# Patient Record
Sex: Female | Born: 1955 | Race: Black or African American | Hispanic: No | Marital: Married | Smoking: Former smoker
Health system: Southern US, Community
[De-identification: ages and names within clinical notes are randomized; demographics above are authoritative.]

## PROBLEM LIST (undated history)

## (undated) DIAGNOSIS — N189 Chronic kidney disease, unspecified: Secondary | ICD-10-CM

## (undated) DIAGNOSIS — G4730 Sleep apnea, unspecified: Secondary | ICD-10-CM

## (undated) DIAGNOSIS — E2601 Conn's syndrome: Secondary | ICD-10-CM

## (undated) DIAGNOSIS — I639 Cerebral infarction, unspecified: Secondary | ICD-10-CM

## (undated) DIAGNOSIS — E785 Hyperlipidemia, unspecified: Secondary | ICD-10-CM

## (undated) DIAGNOSIS — R413 Other amnesia: Secondary | ICD-10-CM

## (undated) DIAGNOSIS — I10 Essential (primary) hypertension: Secondary | ICD-10-CM

## (undated) DIAGNOSIS — Z9289 Personal history of other medical treatment: Secondary | ICD-10-CM

## (undated) HISTORY — DX: Conn's syndrome: E26.01

## (undated) HISTORY — DX: Chronic kidney disease, unspecified: N18.9

## (undated) HISTORY — DX: Hyperlipidemia, unspecified: E78.5

## (undated) HISTORY — DX: Personal history of other medical treatment: Z92.89

## (undated) HISTORY — DX: Other amnesia: R41.3

## (undated) HISTORY — DX: Essential (primary) hypertension: I10

## (undated) HISTORY — DX: Cerebral infarction, unspecified: I63.9

## (undated) HISTORY — DX: Sleep apnea, unspecified: G47.30

---
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
## 1985-05-09 HISTORY — PX: UTERINE FIBROID SURGERY: SHX826

## 1991-05-10 HISTORY — PX: ABDOMINAL HYSTERECTOMY: SHX81

## 1998-05-27 ENCOUNTER — Other Ambulatory Visit: Admission: RE | Admit: 1998-05-27 | Discharge: 1998-05-27 | Payer: Self-pay | Admitting: Obstetrics and Gynecology

## 1999-06-04 ENCOUNTER — Other Ambulatory Visit: Admission: RE | Admit: 1999-06-04 | Discharge: 1999-06-04 | Payer: Self-pay | Admitting: *Deleted

## 1999-08-17 ENCOUNTER — Encounter: Admission: RE | Admit: 1999-08-17 | Discharge: 1999-08-17 | Payer: Self-pay | Admitting: Obstetrics & Gynecology

## 1999-08-17 ENCOUNTER — Encounter: Payer: Self-pay | Admitting: Obstetrics & Gynecology

## 2000-01-06 ENCOUNTER — Other Ambulatory Visit: Admission: RE | Admit: 2000-01-06 | Discharge: 2000-01-06 | Payer: Self-pay | Admitting: *Deleted

## 2000-07-07 ENCOUNTER — Other Ambulatory Visit: Admission: RE | Admit: 2000-07-07 | Discharge: 2000-07-07 | Payer: Self-pay | Admitting: *Deleted

## 2000-08-17 ENCOUNTER — Encounter: Admission: RE | Admit: 2000-08-17 | Discharge: 2000-08-17 | Payer: Self-pay | Admitting: Obstetrics & Gynecology

## 2000-08-17 ENCOUNTER — Encounter: Payer: Self-pay | Admitting: Obstetrics & Gynecology

## 2000-09-25 ENCOUNTER — Encounter: Admission: RE | Admit: 2000-09-25 | Discharge: 2000-12-06 | Payer: Self-pay | Admitting: Pulmonary Disease

## 2002-07-12 ENCOUNTER — Other Ambulatory Visit: Admission: RE | Admit: 2002-07-12 | Discharge: 2002-07-12 | Payer: Self-pay | Admitting: Obstetrics & Gynecology

## 2003-08-19 ENCOUNTER — Ambulatory Visit (HOSPITAL_BASED_OUTPATIENT_CLINIC_OR_DEPARTMENT_OTHER): Admission: RE | Admit: 2003-08-19 | Discharge: 2003-08-19 | Payer: Self-pay | Admitting: Family Medicine

## 2003-09-12 ENCOUNTER — Ambulatory Visit (HOSPITAL_BASED_OUTPATIENT_CLINIC_OR_DEPARTMENT_OTHER): Admission: RE | Admit: 2003-09-12 | Discharge: 2003-09-12 | Payer: Self-pay | Admitting: Family Medicine

## 2003-10-03 ENCOUNTER — Other Ambulatory Visit: Admission: RE | Admit: 2003-10-03 | Discharge: 2003-10-03 | Payer: Self-pay | Admitting: Obstetrics & Gynecology

## 2004-01-16 ENCOUNTER — Ambulatory Visit: Payer: Self-pay | Admitting: Infectious Diseases

## 2004-01-16 ENCOUNTER — Encounter (INDEPENDENT_AMBULATORY_CARE_PROVIDER_SITE_OTHER): Payer: Self-pay | Admitting: *Deleted

## 2004-01-16 ENCOUNTER — Inpatient Hospital Stay (HOSPITAL_COMMUNITY): Admission: EM | Admit: 2004-01-16 | Discharge: 2004-01-19 | Payer: Self-pay | Admitting: Emergency Medicine

## 2004-05-09 DIAGNOSIS — I639 Cerebral infarction, unspecified: Secondary | ICD-10-CM

## 2004-05-09 HISTORY — DX: Cerebral infarction, unspecified: I63.9

## 2004-05-18 ENCOUNTER — Inpatient Hospital Stay (HOSPITAL_COMMUNITY): Admission: EM | Admit: 2004-05-18 | Discharge: 2004-05-21 | Payer: Self-pay | Admitting: *Deleted

## 2004-05-21 ENCOUNTER — Inpatient Hospital Stay (HOSPITAL_COMMUNITY)
Admission: RE | Admit: 2004-05-21 | Discharge: 2004-06-09 | Payer: Self-pay | Admitting: Physical Medicine & Rehabilitation

## 2004-05-21 ENCOUNTER — Ambulatory Visit: Payer: Self-pay | Admitting: Physical Medicine & Rehabilitation

## 2004-07-12 ENCOUNTER — Encounter
Admission: RE | Admit: 2004-07-12 | Discharge: 2004-10-10 | Payer: Self-pay | Admitting: Physical Medicine & Rehabilitation

## 2004-07-13 ENCOUNTER — Ambulatory Visit: Payer: Self-pay | Admitting: Physical Medicine & Rehabilitation

## 2004-08-02 ENCOUNTER — Encounter
Admission: RE | Admit: 2004-08-02 | Discharge: 2004-10-28 | Payer: Self-pay | Admitting: Physical Medicine & Rehabilitation

## 2004-09-08 ENCOUNTER — Ambulatory Visit: Payer: Self-pay | Admitting: Internal Medicine

## 2004-09-14 ENCOUNTER — Ambulatory Visit: Payer: Self-pay | Admitting: Physical Medicine & Rehabilitation

## 2004-09-20 ENCOUNTER — Ambulatory Visit: Payer: Self-pay

## 2004-10-15 ENCOUNTER — Encounter: Admission: RE | Admit: 2004-10-15 | Discharge: 2005-01-13 | Payer: Self-pay | Admitting: Pulmonary Disease

## 2004-10-15 ENCOUNTER — Ambulatory Visit: Payer: Self-pay | Admitting: Physical Medicine & Rehabilitation

## 2004-10-15 ENCOUNTER — Ambulatory Visit: Payer: Self-pay | Admitting: Internal Medicine

## 2005-01-06 ENCOUNTER — Ambulatory Visit: Payer: Self-pay | Admitting: Internal Medicine

## 2005-01-17 ENCOUNTER — Encounter
Admission: RE | Admit: 2005-01-17 | Discharge: 2005-04-17 | Payer: Self-pay | Admitting: Physical Medicine & Rehabilitation

## 2005-01-17 ENCOUNTER — Ambulatory Visit: Payer: Self-pay | Admitting: Physical Medicine & Rehabilitation

## 2005-06-15 ENCOUNTER — Encounter: Admission: RE | Admit: 2005-06-15 | Discharge: 2005-09-13 | Payer: Self-pay | Admitting: Pulmonary Disease

## 2005-07-08 ENCOUNTER — Ambulatory Visit: Payer: Self-pay | Admitting: Physical Medicine & Rehabilitation

## 2005-07-08 ENCOUNTER — Encounter
Admission: RE | Admit: 2005-07-08 | Discharge: 2005-10-06 | Payer: Self-pay | Admitting: Physical Medicine & Rehabilitation

## 2005-08-17 ENCOUNTER — Encounter (INDEPENDENT_AMBULATORY_CARE_PROVIDER_SITE_OTHER): Payer: Self-pay | Admitting: *Deleted

## 2005-08-17 ENCOUNTER — Ambulatory Visit (HOSPITAL_COMMUNITY): Admission: RE | Admit: 2005-08-17 | Discharge: 2005-08-17 | Payer: Self-pay | Admitting: *Deleted

## 2005-09-05 ENCOUNTER — Ambulatory Visit: Payer: Self-pay | Admitting: Physical Medicine & Rehabilitation

## 2005-10-12 ENCOUNTER — Encounter: Admission: RE | Admit: 2005-10-12 | Discharge: 2005-10-12 | Payer: Self-pay | Admitting: Endocrinology

## 2005-11-28 ENCOUNTER — Ambulatory Visit: Payer: Self-pay | Admitting: Physical Medicine & Rehabilitation

## 2005-11-28 ENCOUNTER — Encounter
Admission: RE | Admit: 2005-11-28 | Discharge: 2006-02-26 | Payer: Self-pay | Admitting: Physical Medicine & Rehabilitation

## 2006-10-13 ENCOUNTER — Encounter: Admission: RE | Admit: 2006-10-13 | Discharge: 2006-10-13 | Payer: Self-pay | Admitting: Endocrinology

## 2006-10-19 ENCOUNTER — Ambulatory Visit: Payer: Self-pay | Admitting: Physical Medicine & Rehabilitation

## 2006-10-19 ENCOUNTER — Encounter
Admission: RE | Admit: 2006-10-19 | Discharge: 2007-01-17 | Payer: Self-pay | Admitting: Physical Medicine & Rehabilitation

## 2006-11-01 ENCOUNTER — Encounter: Admission: RE | Admit: 2006-11-01 | Discharge: 2006-11-01 | Payer: Self-pay | Admitting: Endocrinology

## 2006-11-15 ENCOUNTER — Encounter: Admission: RE | Admit: 2006-11-15 | Discharge: 2006-11-15 | Payer: Self-pay | Admitting: Endocrinology

## 2007-01-17 ENCOUNTER — Encounter: Admission: RE | Admit: 2007-01-17 | Discharge: 2007-01-17 | Payer: Self-pay | Admitting: General Surgery

## 2007-01-19 ENCOUNTER — Encounter (INDEPENDENT_AMBULATORY_CARE_PROVIDER_SITE_OTHER): Payer: Self-pay | Admitting: General Surgery

## 2007-01-19 ENCOUNTER — Encounter: Admission: RE | Admit: 2007-01-19 | Discharge: 2007-01-19 | Payer: Self-pay | Admitting: General Surgery

## 2007-01-19 ENCOUNTER — Ambulatory Visit (HOSPITAL_BASED_OUTPATIENT_CLINIC_OR_DEPARTMENT_OTHER): Admission: RE | Admit: 2007-01-19 | Discharge: 2007-01-19 | Payer: Self-pay | Admitting: General Surgery

## 2007-03-08 ENCOUNTER — Encounter
Admission: RE | Admit: 2007-03-08 | Discharge: 2007-03-09 | Payer: Self-pay | Admitting: Physical Medicine & Rehabilitation

## 2007-03-08 ENCOUNTER — Ambulatory Visit: Payer: Self-pay | Admitting: Physical Medicine & Rehabilitation

## 2007-03-22 ENCOUNTER — Encounter
Admission: RE | Admit: 2007-03-22 | Discharge: 2007-05-21 | Payer: Self-pay | Admitting: Physical Medicine & Rehabilitation

## 2007-07-02 ENCOUNTER — Encounter
Admission: RE | Admit: 2007-07-02 | Discharge: 2007-07-02 | Payer: Self-pay | Admitting: Physical Medicine & Rehabilitation

## 2007-07-02 ENCOUNTER — Ambulatory Visit: Payer: Self-pay | Admitting: Physical Medicine & Rehabilitation

## 2007-12-25 ENCOUNTER — Encounter
Admission: RE | Admit: 2007-12-25 | Discharge: 2007-12-26 | Payer: Self-pay | Admitting: Physical Medicine & Rehabilitation

## 2007-12-26 ENCOUNTER — Ambulatory Visit: Payer: Self-pay | Admitting: Physical Medicine & Rehabilitation

## 2009-01-09 ENCOUNTER — Encounter
Admission: RE | Admit: 2009-01-09 | Discharge: 2009-01-14 | Payer: Self-pay | Admitting: Physical Medicine & Rehabilitation

## 2009-01-14 ENCOUNTER — Ambulatory Visit: Payer: Self-pay | Admitting: Physical Medicine & Rehabilitation

## 2009-01-26 ENCOUNTER — Encounter
Admission: RE | Admit: 2009-01-26 | Discharge: 2009-04-08 | Payer: Self-pay | Admitting: Physical Medicine & Rehabilitation

## 2009-12-30 ENCOUNTER — Encounter
Admission: RE | Admit: 2009-12-30 | Discharge: 2010-03-30 | Payer: Self-pay | Admitting: Physical Medicine & Rehabilitation

## 2010-01-01 ENCOUNTER — Ambulatory Visit: Payer: Self-pay | Admitting: Physical Medicine & Rehabilitation

## 2010-09-21 NOTE — Op Note (Signed)
NAMELUCIELLA, AUSTRIA              ACCOUNT NO.:  [id number]   MEDICAL RECORD NO.:  [id number]          PATIENT TYPE:  AMB   LOCATION:  DSC                          FACILITY:  [facility]   PHYSICIAN:  Edsel Petrin. Dalbert Batman, M.D.DATE OF BIRTH:  26-Jan-1956   DATE OF PROCEDURE:  01/19/2007  DATE OF DISCHARGE:                               OPERATIVE REPORT   PREOPERATIVE DIAGNOSIS:  Bilateral microcalcifications of the breast.   POSTOPERATIVE DIAGNOSIS:  Bilateral microcalcifications of the breast,  rule out ductal carcinoma in situ.   OPERATION PERFORMED:  1. Right breast biopsy with needle localization and specimen      mammogram.  2. Left breast biopsy with needle localization and specimen mammogram.   SURGEON:  Dr. Fanny Skates.   OPERATIVE INDICATIONS:  This is a 55 year old black female who underwent  screening mammograms and digital diagnostic mammograms in June of this  year.  There were areas of focal calcifications in the right and left  breast.  These clusters of calcifications were noted deep within the mid  to lateral aspect of both breasts.  They were both felt to be  suspicious.  The patient has had a stroke and is diabetic and does not  have good bladder control and some difficulty breathing and so the  [facility] felt that they would be unable to perform a  stereotactic biopsy.  The patient was referred to me.  Physical exam  does not show any focal mask although the breasts are large and a little  bit lumpy.  I agreed that these area should be biopsied for pathologic  confirmation. This was discussed with her and she is brought electively.   OPERATIVE TECHNIQUE:  The patient underwent bilateral needle  localization at the [facility] this morning.  Both  wires entered laterally and were directed deep within the breast on each  side directed toward the center of the breast.  The wires were placed  very adequately.  The patient was brought  to [facility] and  then to the operating room where a general anesthetic was induced.  Both  breasts were prepped and draped in a sterile fashion.  The patient was  identified as the correct patient, correct procedure and correct site.  0.5% Marcaine with epinephrine was used as local infiltration  anesthetic.   We did the right breast biopsy first.  A transverse radially oriented  incision was made laterally in the right breast at about the 9 o'clock  position.  Dissection was carried down into the breast tissue and we  went deeply into the breast tissue medially and posteriorly following  the wire until we excised all of the breast tissue around the wire.  The  specimen was marked with silk sutures to orient the pathologist.  Specimen mammogram was performed and the radiologist called and said  that we had completely removed the calcifications. This wound was packed  after obtaining hemostasis with electrocautery.   On the left side we then performed the breast biopsy.  In a similar  manner, we placed a transverse radially  oriented incision through the  insertion site of the wire.  Dissection was carried down deep into the  breast tissue and we were able to get all around the wire without too  much difficulty.  We marked the specimen with silk sutures.  The  Specimen mammogram looked good showing that the calcifications had been  completely removed.  Both wounds were copiously irrigated with saline  until the irrigation fluid was completely clear.  Hemostasis was  excellent on both sides achieved with electrocautery.  The wounds were  deep and so I closed them and in several layers, a deep layer of  interrupted 3-0 Vicryl.  A superficial layer of interrupted 3-0 Vicryl  and the skin incisions were both closed  with running subcuticular sutures of 4-0 Monocryl and Steri-Strips.  Clean bandages were placed and the patient taken to the recovery room in  stable condition.   Estimated blood loss was about Q000111Q mL total.  Complications none. Sponge, needle and instrument counts were correct.      Edsel Petrin. Dalbert Batman, M.D.  Electronically Signed     HMI/MEDQ  D:  01/19/2007  T:  01/20/2007  Job:  [id number]

## 2010-09-21 NOTE — Assessment & Plan Note (Signed)
Robin Foley is back regarding her mid-brain hemorrhage.  I last saw her about a  year ago.  She has not changed a great deal since I saw her.  She has  been limited with her activity.  She uses a cane for balance.  She  denies any falls or new problems at home.  She continues to have  difficulties with concentration, memory, and organization.  She  complains of some low back pain.  She has problems with balance and  sensation still.  Vision is an issue with diplopia reported.  She also  reports recent problems with urinary incontinence.  She sleeps fairly  well.  She is active around her house and tries to keep up with  household activities, albeit a struggle at times.   REVIEW OF SYSTEMS:  Notable for the above as well as weight gain, urine  retention, shortness of breath, sleep apnea, trouble walking, tingling.   SOCIAL HISTORY:  Patient is married living with her husband and family.   PHYSICAL EXAMINATION:  Blood pressure is 171/74, pulse is 79,  respiratory rate 20, she is sating 96% on room air.  The patient is  generally pleasant.  Alert and oriented x3.  She needs queues sometimes  for organization and attention but generally is functional on a  conversational level with her cognition.  Motor function is generally 4+  to 5 out of 5 on the right, and 5 out of 5 on the left.  She has  decreased sensation still on the right side today.  When she ambulated  she tends to walk with an anteriorly tilted pelvis and weight shift  forward.  She often got in front of her cane and seemed to be at risk  for falling.  CRANIAL NERVE EXAM:  She revealed mild disconjugate eye movement.  I  would say overall her awareness and attention was improved from last  visit.  HEART:  Regular.  CHEST:  Clear.  ABDOMEN:  Soft, nontender.  She remains obese.   ASSESSMENT:  1. Right mid-brain intracranial hemorrhage.  2. Insulin-requiring diabetes.  3. Hypertension.  4. Obesity.  5. Diabetic peripheral  neuropathy.  6. Low back pain.   PLAN:  1. Encouraged further exercise and diet control.  She seems to      continue struggling with these areas.  2. Filled out extensive insurance paperwork for her today.  I do not      see her being able to return to work due to her physical and      cognitive deficits.  3. Medical management per Dr. Wilson Singer.  He needs to check on her blood      pressure at his next appointment with her.      Meredith Staggers, M.D.  Electronically Signed    ZTS/MedQ  D:  10/24/2006 12:45:44  T:  10/24/2006 15:49:48  Job #:  [id number]   cc:   Ronaldo Miyamoto, M.D.  Fax: [phone]

## 2010-09-21 NOTE — Assessment & Plan Note (Signed)
Robin Foley is here in followup of her right mid brain hemorrhage.  She had questions about returning to physical therapy.  She uses a cane  for balance.  She would like to go back to driving.  She wants to get  off of her cane.  She states that she has had no problems with her  vision.  Family still has concerns of over-impulsivity and attention  span.  She is really not doing much exercise and has gained significant  weight since I last saw her.   REVIEW OF SYSTEMS:  Notable for the above.  She has occasional bladder  problems as well.  Full review is in the written health and history  section of the chart.   SOCIAL HISTORY:  The patient is married and living with her family.   PHYSICAL EXAM:  Blood pressure is 157/67, pulse 80, respiratory rate 16.  She is satting 98% on room air.  The patient has gained noticeable weight.  Motor function remains 4+/5  to 5/5 on the right side and similar on the left.  She has decreased  sensation still on the left leg and arm.  She tends to hyperextend the  left knee in stance phase of gait.  I had her walk today and she was  walking very quickly, and I asked her to turn around, and she spun  quickly and lost her balance, and fell to the floor.  Fortunately, she  was able to catch herself and had no significant injury.  She is able to  stand up with minimal assistance and walk normally back to the exam  room.  Gaze appeared to be intact for conjugate pursuit.  She had no  blurred vision, double vision, or field cuts today.  Speech is a little  bit dysarthric.  The patient does lack some awareness still in  attention.  HEART:  Regular rate.  CHEST:  Clear.  ABDOMEN:  Soft and nontender.  She remains obese.   ASSESSMENT:  1. Right mid brain endocranial hemorrhage.  2. Insulin requiring diabetes.  3. Hypertension.  4. Morbid obesity.  5. Diabetic peripheral neuropathy.  6. Low back pain.   PLAN:  1. I will send her back to physical  therapy to work on gait and safety      awareness.  I am not sure that she will be able to be a driver, but      will look at that down the line.  2. Discussed appropriate diet and exercise in the future.  She is      going to need to work on this to gain better control of her sugars,      as well as her balance.  3. I will see her back in about 3 months' time for followup.  Again,      the patient appeared to be without injury upon leaving the office      today.      Meredith Staggers, M.D.  Electronically Signed     ZTS/MedQ  D:  03/09/2007 15:43:11  T:  03/10/2007 15:30:50  Job #:  [id number]   cc:   Ronaldo Miyamoto, M.D.  Fax: [phone]

## 2010-09-21 NOTE — Assessment & Plan Note (Signed)
Robin Foley is back regarding her right mid brain hemorrhage.  She had since  gone back to physical therapy and made some gains but did not follow it  up with home program.  She is really  not doing much at home. She walks  with a cane.  She complains of increasing right knee pain and has some  pain in her fingers.  Pain does not effect her sleep, however.   On review of systems she reports some bladder control problems, weight  gain, night sweats, high sugars at times.  Full review is in her written  health and history section under pertinent positives as above.   SOCIAL HISTORY:  The patient is married living with her husband and  children.   PHYSICAL EXAMINATION:  VITALS:  Blood pressure is 150/70, pulse 75,  respiratory rate 20.  She is satting 99% on room air.  EXTREMITIES:  The patient has good motor function on the right side,  4+/5.  She continues to have some decreased overall sensation left arm  and leg.  She has decreased sensation distally in both hands and feet.  She walked for me today and is antalgic to the right side.  There is  some crepitus with active flexion extension of the knee.  She had no  knee instability per se.  Some mild peripatellar swelling was  appreciated.  Gait is stable.  NEURO:  Cognitively she was fair for insight and awareness, memory.  Cranial nerve is unremarkable.  Speech is clear.  HEART:  Regular.  CHEST:  Clear.  ABDOMEN:  Soft, nontender.  She remains morbidly obese.   ASSESSMENT:  1. History of right mid brain intracranial hemorrhage.  2. Insulin requiring diabetes.  3. Hypertension.  4. Morbid obesity.  5. Diabetic peripheral neuropathy.  6. Low back pain.  7. Osteoarthritis of the right knee.   PLAN:  1. The patient would be interested  in aggressive care for the right      knee at this point.  We did discuss weight loss which is paramount      for her.  We discussed the idea of osteoarthritis knee brace which      may be of some  benefit.  Recommended glucosamine with chondroitin      for now.  She is not an antiinflammatory candidate due to her blood      pressure and stroke issues.  She needs to be more serious about her      diet and work on better control with this.  2. Continue cane for balance to consider anticonvulsant for      neuropathic pain depending on progression. I think she could do      best with weight loss and sugar control.  3. I will see her back in about 4 months time.      Meredith Staggers, M.D.  Electronically Signed     ZTS/MedQ  D:  07/02/2007 11:46:36  T:  07/02/2007 15:35:32  Job #:  [id number]

## 2010-09-21 NOTE — Assessment & Plan Note (Signed)
Robin Foley is back regarding her right midbrain hemorrhage.  She still is  having some problems with balance and mobility.  She does use her  treadmill, is moving a bit better with that and has less knee buckling.  She has had a fall over the summer.  She does report some dizziness when  she in on her treadmill and she is with the children.  She states that  the room will spin.  She does not note that it has been worsening or  happening at other times.  She is having occasional pain in her knees  and hands.  The tingling in her hands is somewhat stable.  She is  sleeping fairly well at this point.   REVIEW OF SYSTEMS:  Notable for tingling, trouble walking, and bladder  control issues.  Other pertinent positives are above, and full review is  in the written 14-point review of systems.   SOCIAL HISTORY:  The patient is married and living with her husband and  children still.   PHYSICAL EXAMINATION:  VITAL SIGNS:  Blood pressure is 150/62, pulse is  68, respiratory rate 18, and she is satting 99% on room air.  NEUROLOGIC:  The patient is generally alert and appropriate. The patient  still lacks some insight and awareness.  Memory is fair.  Strength is  4+/5 on the right side and 5/5 on the left, with decreased sensation  distally in the hands, left more than the right, today.  She also had  some sensory loss in the feet.  She has some crepitus with active and  passive knee flexion and extension.  Gait is stable, but she favors the  left side a little bit more.  She holds her cane for balance.  She has  difficulty transferring.  HEART:  Regular.  CHEST:  Clear.  ABDOMEN:  Soft and nontender.  The patient remains morbidly obese.   ASSESSMENT:  1. History of right midbrain intracranial hemorrhage.  2. Insulin-requiring diabetes.  3. Hypertension.  4. Morbid obesity.  5. Diabetic peripheral neuropathy.  6. Low back pain.  7. Osteoarthritis of the right knee.   PLAN:  1. Continue home  exercise program.  She is not appropriate for      returning to work due to ongoing cognitive and balance/neurological      deficits.  I filled out paperwork today for her disability      purposes.  2. I will see her back on as-needed basis.  Recommended ongoing home      exercise as safety and tolerance dictate.      Meredith Staggers, M.D.  Electronically Signed     ZTS/MedQ  D:  12/26/2007 13:09:51  T:  12/27/2007 05:14:44  Job #:  [id number]   cc:   Ronaldo Miyamoto, M.D.  Fax: [phone]

## 2010-09-24 NOTE — Assessment & Plan Note (Signed)
Robin Foley is back regarding her right midbrain hemorrhage.  She states that  not much as changed since last year, although she has had some left knee  pain.  She may be having some more problems through her gait.  She uses  a cane around the home, but furniture walks to a large extent.  She  denies frank pain today.  She has been on Celebrex for the last 6  months.  Moods have been stable.   REVIEW OF SYSTEMS:  Notable for trouble walking, weakness, occasional  bladder issues, weight gain.  She reports some improvement in her sleep  overall.  Full 14-point review is in the written health and history  section of the chart.   SOCIAL HISTORY:  The patient is married and living with her family.  Husband again accompanies her today.   PHYSICAL EXAMINATION:  VITAL SIGNS:  Blood pressure is 159/78, pulse is  78, respiratory rate is 16.  She is sating 96% on room air.  GENERAL:  The patient is generally alert and appropriate.  She continues  to lack through insight and awareness, although is doing a bit better in  that regard today from her last visit.  Memory is fair to good.  EXTREMITIES:  Strength is generally 4+-5/5 bilaterally.  She has some  pain inhibition still at the left knee.  She stands to transfer.  She is  unable to fully put weight to the left leg due to some discomfort.  She  has some distal sensory loss in the lower more than the upper  extremities.  Gait is notable for decreased balance.  She has to rely on  the cane and furniture as well as her husband for support and balance.  She tends to stand with a flexed posture.  She has some antalgia to the  left side as well.  HEART:  Regular.  CHEST:  Clear.  ABDOMEN:  Soft, nontender.  She remains morbidly overweight.   ASSESSMENT:  1. Right midbrain intracranial hemorrhage.  2. Insulin-requiring diabetes with neuropathy.  3. Hypertension.  4. Morbid obesity.  5. Low back pain.  6. History of osteoarthritis of the knees left  greater than right now.   PLAN:  1. The patient needs to be seen by therapy to assess gait.  I think      she needs to go to a walker as I think she is at great risk for      further falls with her cane when she is by herself.  2. Recommend glucosamine for left knee.  She may continue Celebrex per      her other doctors recommendation.  She needs to lose weight which      is an ongoing problem and certainly not helping her picture.  3. I will see her back in about a year.  I filled out her necessary      paperwork again today.      Meredith Staggers, M.D.  Electronically Signed     ZTS/MedQ  D:  01/14/2009 10:50:04  T:  01/14/2009 23:00:52  Job #:  [id number]

## 2010-09-24 NOTE — H&P (Signed)
NAME:  Robin, Foley              ACCOUNT NO.:  [id number]   MEDICAL RECORD NO.:  [id number]          PATIENT TYPE:  EMS   LOCATION:  [facility]                         FACILITY:  [facility]   PHYSICIAN:  Pramod P. Leonie Man, MD    DATE OF BIRTH:  [birth year]   DATE OF ADMISSION:  05/18/2004  DATE OF DISCHARGE:                                HISTORY & PHYSICAL   ADMISSION DIAGNOSIS:  Hemorrhage.   REFERRING PHYSICIAN:  Kyra Searles, M.D.   CLINICAL HISTORY:  Mrs. Robin Foley is a 55 year old African-American lady who  developed sudden onset of left-sided paresthesias and hand weakness and  incoordination at 7:15 a.m. today.  The patient states she woke up at 6 a.m.  and felt fine and had no symptoms.  She also noticed some bifrontal headache  following this.  Symptoms have persisted.  She presented to the emergency  room within 45 minutes of onset of symptoms and a code stroke was called.  We responded immediately.  The patient was examined by me at 9 a.m. and was  found to be awake, alert, and have mild left-sided weakness and objective  dysesthesias.  She has no prior history of stroke, TIA, or significant  medical problems.  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   PAST MEDICAL HISTORY:  Significant for:  1.  Diabetes.  2.  Hypertension.  3.  Hyperlipidemia.  4.  Mitral regurgitation.  5.  Obstructive sleep apnea.  6.  Depression.  7.  Viral meningitis.   MEDICATION LIST:  Avandamet, Zoloft, Norvasc, glipizide, albuterol,  atenolol, Hyzaar, Xanax.   MEDICATION ALLERGIES:  PENICILLIN.   SOCIAL HISTORY:  She works as a Programmer, systems.  She  lives in [place] with her husband.  She does not do drugs. She smokes  one pack per day for 27 years.  She drinks alcohol occasionally.   REVIEW OF SYSTEMS:  Not significant for recent fever, cough, chest pain,  diarrhea, or palpitations.   FAMILY HISTORY:  Not significant for anybody with stroke.  Mother had ALS.   PHYSICAL EXAMINATION:  GENERAL:  Reveals a pleasant, middle-aged African-  American lady who is not in distress.  VITAL SIGNS:  She is afebrile, pulse rate 72 of per minute and regular,  respiratory rate 18 per minute, blood pressure 176/90, distal pulses well  felt,  saturation 100% on room air.  HEENT:  Head is nontraumatic.  Neck is supple without bruit.  ENT exam  unremarkable.  CARDIAC:  No murmur or gallop.  LUNGS:  Clear to auscultation.  NEUROLOGIC:  The patient is pleasant, awake, alert, cooperative.  There is  no aphasia, apraxia, or dysarthria.  Pupils are 4 mm, equally reactive to  light and accommodation.  Visual acuity and fields are adequate.  I did not  get any visual field deficits.  Face is symmetric, palate movements are  normal, tongue is midline.  Motor system exam reveals no upper extremity  drift but fine finger movements are diminished on  the left.  She has  weakness of the left grip and intrinsic hand muscles.  She orbits the right  over left upper extremity.  Lower extremity strength is symmetric.  Deep  tendon reflexes are 2+ and symmetric.  Left plantar is upgoing, right is  downgoing.  She has subjective dysesthesia on the left side but no objective  sensory loss.  She has mild sensory ataxia on the left.  Gait was not  tested.   DATA REVIEWED:  Noncontrast head CT scan shows an area of hemorrhage in the  right midbrain involving the superior cerebellar peduncle.  There is no  subarachnoid hemorrhage.  Coagulation labs are normal.  Wbc count is  unremarkable.   IMPRESSION:  A 55 year old lady with left-sided paresthesias, weakness, and  incoordination due to right lateral midbrain hemorrhage, likely of  hypertensive etiology.   PLAN:  The patient is being admitted to the stroke service for further  evaluation.  Will maintain aggressive control of  blood pressure, continue  her home medications, use IV labetalol p.r.n., keep blood pressure systolic  in the [id number] range.  Repeat CT scan in the morning.  The patient may  qualify for the NovoSeven protocol with factor VIIa recombinant inhibitor to  prevent recurrent hemorrhage.  We will screen the patient for the same and  see if she qualifies and consider her for this.  Continue treatment for her  diabetes and hypertension and hyperlipidemia.  I had a long discussion with  the patient and her husband regarding her symptoms.  Discussed the plan for  evaluation, treatment, and answered questions.       PPS/MEDQ  D:  05/18/2004  T:  05/18/2004  Job:  [id number]

## 2010-09-24 NOTE — Discharge Summary (Signed)
NAMEMarland Kitchen  KANOSHA, Robin Foley              ACCOUNT NO.:  [id number]   MEDICAL RECORD NO.:  [id number]          PATIENT TYPE:  IPS   LOCATION:  [id number]                         FACILITY:  [facility]   PHYSICIAN:  Meredith Staggers, M.D.DATE OF BIRTH:  Aug 25, 1955   DATE OF ADMISSION:  05/21/2004  DATE OF DISCHARGE:  06/09/2004                                 DISCHARGE SUMMARY   DISCHARGE DIAGNOSES:  1.  Right mid brain intracranial hemorrhage.  2.  History of diabetes.  3.  History of hypertension.  4.  Urinary retention, improved.  5.  History of depression.   HISTORY OF PRESENT ILLNESS:  The patient is a 55 year old African-American  female with past history of hypertension, diabetes, and hyperlipidemia, who  presents on May 18, 2004, with right-sided paresthesias and hand  weakness.  Head CT revealed a hemorrhage in the right mid brain secondary to  blood pressure.  The patient received __________ treatment factor with  factor VIIA recombinant inhibitor.  Carotid Dopplers reveal no ICA stenosis  bilaterally.  PT report indicates the patient is ambulating approximately  100 feet minimum assistance with cane, bed mobility supervision, tests with  minimum assistance.  Transcranial Dopplers left MCA velocity likely due to  distal stenosis, positive double vision.  The patient was transferred to  [facility] on May 21, 2004.   REVIEW OF SYSTEMS:  Significant for shortness of breath and cough, denies  any chest pain.  Positive for weakness and depression.  Denies any weight  loss or nausea and vomiting.   PAST MEDICAL HISTORY:  1.  Diabetes.  2.  Hypertension.  3.  Tobacco abuse.  4.  Meningitis.  5.  Depression.  6.  Hyperlipidemia.  7.  Mild OSA.  8.  Benign breast tumor.   PAST SURGICAL HISTORY:  Significant for  hysterectomy.   SOCIAL HISTORY:  The patient lives in [place] with husband in two-  level home, four steps to entry.  Tobacco for 27 years.   Drinks alcohol  occasionally.  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  She was independent  prior to admission.   MEDICATIONS PRIOR TO ADMISSION:  1.  Albuterol inhaler.  2.  Atenolol 100 mg 1 tablet daily.  3.  Glucotrol 5 mg b.i.d.  4.  Avandamet 1000 mg b.i.d.  5.  Hyzaar 100/25 mg daily.  6.  Norvasc 10 mg daily.  7.  Zoloft 50 mg daily.   ALLERGIES:  PENICILLIN   HOSPITAL COURSE:  Robin Foley was admitted to [facility] on May 21, 2004.  She had over 3 hours of  therapy daily.  Overall, she progressed fairly well during 28-day stay in  rehab.  She was discharged in overall supervision level.  She had no  significant new neurological symptoms while in rehab.  She still had  occasional headache and blurry vision.  At time of discharge, indicated bed  mobility modified independent, transfer close supervision, and sit to stand,  squat to pivot close supervision.  She was able to ambulate minimum  assistance 200 feet with rolling walker.  Able to  perform most ADLs  supervision and minimum assistance level.  Basic cognition was within  functional limits.  Short-term memory mostly within functional limits.  The  patient made good progress in therapy.  She met her goals.  Dysarthria, she  is more intelligible with reading and in conversation.  The patient met all  her goals.  The patient is supervision level for tasks secondary to  decreased vision and decreased use of upper extremities.  The patient is at  minimum assistance for dynamic standing activities.   For urinary tract infection, the patient was started on Cipro 250 mg p.o.  b.i.d. for urinary tract infection.   Next problem, urinary retention.  The patient did have a neurogenic bladder  and difficulty voiding.  She was started on Flomax 0.4 mg p.o. as well as  Urecholine 10 mg b.i.d. on May 26, 2004.  Slowly, Urecholine had to be  tapered up to 25 mg t.i.d.  The patient began to void  very well, and Flomax  was discontinued at the time of discharge, and Urecholine began to be  tapered.   Next problem number is diabetes mellitus.  The patient's sugars were under  great control while in rehab.  She remained on a carbohydrate-modified diet.  CBGs were checked twice daily.  The patient did have one hypoglycemic  episode.  At time of admission, the patient was on Glucophage 1000 mg b.i.d.  as well as Glucotrol 10 mg daily and Avandia 4 mg b.i.d.  Avandia was placed  on hold as well as Glucotrol. T p is presently taking only Glucophage 5 mg  b.i.d.   Next problem  is history of hypertension.  Blood pressure remained under  reasonably fair control on Norvasc as well as hydrochlorothiazide.  Blood  pressure a the time of discharge was 137/74.  The patient will follow up  with primary care physician regarding blood pressure.   As far as diplopia, the patient wears rotating eye patch. Diplopia did  improve significantly throughout time in rehab.   There were no other major issues occur while patient was in rehab.   Latest labs show repeat urine culture after antibiotic treatment on June 01, 2004, demonstrating no growth x 1 day.  Latest hemoglobin 12.7,  hematocrit 39.4, white blood cell count 6.7, platelet count 239.  Sodium  139, potassium 3.8, chloride 104, CO2 29, glucose 115, BUN 16, creatinine  0.6, AST 25, ALT 8, alkaline phosphatase 53.   PT report at time of discharge indicated patient did have coordination  problems and has still some mild upper extremity weakness on the left side  still with some ataxia and problems with coordination.  The patient's vision  had improved significant but still had some vision problems.  The patient's  husband got family education.  She was discharged at 25 supervision.  The  patient also went on a patch at the time of discharge, and it went well.   DISCHARGE MEDICATIONS: 1.  Zoloft 50 mg daily.  2.  Norvasc 10 mg daily.   3.  Glucotrol on hold.  4.  Resume Hyzaar 25/100 mg daily.  5.  Avandia is also on hold.  6.  Glucophage 500 mg twice daily.  7.  Urecholine 25 mg.  Follow taper.  8.  Do not take Avandamet, atenolol.  9.  Resume MDI albuterol.  10. Pain management with Tylenol.   No driving, no smoking.  Use walker.  No drinking alcohol.  She requires 24-  hour supervision.  For diet, no concentrated sweet.  Check CBGs twice daily  and record results.  [facility] for PT and OT.  She is to follow up  with Dr. Alger Simons on July 13, 2004, at 2:20.  Follow up with [facility] in four weeks.  Follow up with Dr. Rachell Cipro in  three to four weeks.  Check for blood pressure and diabetes medication  adjustment.      LB/MEDQ  D:  06/09/2004  T:  06/09/2004  Job:  [id number]   cc:   Jill Alexanders, M.D.  [address] [place] [address]  Fax: [phone]   Rachell Cipro, M.D.  [facility]  [place], [place] [address]  Fax: [phone]

## 2010-12-01 ENCOUNTER — Encounter: Payer: Medicare Other | Attending: Physical Medicine & Rehabilitation | Admitting: Physical Medicine & Rehabilitation

## 2010-12-01 DIAGNOSIS — R269 Unspecified abnormalities of gait and mobility: Secondary | ICD-10-CM

## 2010-12-01 DIAGNOSIS — M1710 Unilateral primary osteoarthritis, unspecified knee: Secondary | ICD-10-CM | POA: Insufficient documentation

## 2010-12-01 DIAGNOSIS — R279 Unspecified lack of coordination: Secondary | ICD-10-CM | POA: Insufficient documentation

## 2010-12-01 DIAGNOSIS — R0789 Other chest pain: Secondary | ICD-10-CM | POA: Insufficient documentation

## 2010-12-01 DIAGNOSIS — I619 Nontraumatic intracerebral hemorrhage, unspecified: Secondary | ICD-10-CM | POA: Insufficient documentation

## 2010-12-01 DIAGNOSIS — I609 Nontraumatic subarachnoid hemorrhage, unspecified: Secondary | ICD-10-CM

## 2010-12-01 DIAGNOSIS — M940 Chondrocostal junction syndrome [Tietze]: Secondary | ICD-10-CM | POA: Insufficient documentation

## 2010-12-01 NOTE — Assessment & Plan Note (Signed)
HISTORY:  Robin Foley is back regarding her right midbrain hemorrhage.  She generally has been stable since I last saw her.  She still has some issues with balance and cognition.  She says that her memory has gotten worse as well as her speech.  She is really not doing much in the way of activities around the home.  She is limited to household movement.  She reports increased swelling and chest discomfort over the last few months.  I asked her today recent colds or infections.  She states that she have a bronchitis back in the late spring and since then adjust this.  REVIEW OF SYSTEMS:  Notable for weight gain.  She is not eating correctly.  She has occasional urine retention.  Full 12-point review is in the written health and history section of the chart.  SOCIAL HISTORY:  The patient is married, living with husband.  PHYSICAL EXAMINATION:  VITAL SIGNS:  Blood pressure 179/68, pulse 76, respirator rate 20, and she is satting 92% on room air. GENERAL:  The patient is pleasant, alert.  She walks with a wide-based slow gait.  She has some balance issues still.  Mood is generally pleasant.  Speech is slightly dysarthric but intelligible.  Insight is fair at best.  Memory is a problem.  She still has difficulties with fine motor coordination in the upper and lower extremities.  She has crepitus in the right knee.  Chest is tender along the inferior half of the sternum on the right side.  At the costochondral margin, she has most her pain particularly at the fourth and fifth ribs.  ASSESSMENT: 1. Right midbrain hemorrhage. 2. Morbid obesity. 3. Osteoarthritis of the right knee. 4. Costochondritis involving the right fourth and fifth ribs.  PLAN: 1. Recommended ice and low-dose ibuprofen.  I believe she has used     Celebrex in the past that she may use some of this if tolerated and     if blood pressure allows.  Generally this should improve on its     own.  If not, she can look at other  more aggressive options     including steroids or therapy.  She will follow up with Dr. Wilson Singer,     regarding this in a week or two. 2. I filled out paperwork today.  The patient remains completely     disabled from work. 3. I encouraged  the patient to work on diet modification and     stimulating activities for her brain.  Really I think she has     fallen into quite a "rut" from a health hygiene, mobility and     cognitive standpoint.     Meredith Staggers, M.D. Electronically Signed    ZTS/MedQ D:  12/01/2010 13:30:13  T:  12/01/2010 17:05:14  Job #:  [id number]  cc:   Ronaldo Miyamoto, M.D. Fax: [phone]

## 2011-02-18 LAB — DIFFERENTIAL
Monocytes Relative: 6
Neutro Abs: 5.4
Neutrophils Relative %: 75

## 2011-02-18 LAB — COMPREHENSIVE METABOLIC PANEL
Albumin: 3.8
BUN: 11
CO2: 32
Calcium: 9.1
Creatinine, Ser: 0.74
GFR calc Af Amer: >60
Potassium: 3.6
Total Protein: 7.2

## 2011-02-18 LAB — URINALYSIS, ROUTINE W REFLEX MICROSCOPIC
Bilirubin Urine: NEGATIVE
Glucose, UA: NEGATIVE
Nitrite: NEGATIVE

## 2011-02-18 LAB — CBC
HCT: 32.2 — ABNORMAL LOW
Hemoglobin: 10.4 — ABNORMAL LOW
MCHC: 32.2
Platelets: 224
RBC: 4.05
RDW: 15.8 — ABNORMAL HIGH
WBC: 7.1

## 2011-05-11 DIAGNOSIS — E119 Type 2 diabetes mellitus without complications: Secondary | ICD-10-CM | POA: Diagnosis not present

## 2011-05-11 DIAGNOSIS — R82998 Other abnormal findings in urine: Secondary | ICD-10-CM | POA: Diagnosis not present

## 2011-05-11 DIAGNOSIS — I10 Essential (primary) hypertension: Secondary | ICD-10-CM | POA: Diagnosis not present

## 2011-05-11 DIAGNOSIS — N390 Urinary tract infection, site not specified: Secondary | ICD-10-CM | POA: Diagnosis not present

## 2011-06-15 DIAGNOSIS — M19079 Primary osteoarthritis, unspecified ankle and foot: Secondary | ICD-10-CM | POA: Diagnosis not present

## 2011-06-15 DIAGNOSIS — R9431 Abnormal electrocardiogram [ECG] [EKG]: Secondary | ICD-10-CM | POA: Diagnosis not present

## 2011-06-15 DIAGNOSIS — M79609 Pain in unspecified limb: Secondary | ICD-10-CM | POA: Diagnosis not present

## 2011-06-15 DIAGNOSIS — I6789 Other cerebrovascular disease: Secondary | ICD-10-CM | POA: Diagnosis not present

## 2011-06-15 DIAGNOSIS — E119 Type 2 diabetes mellitus without complications: Secondary | ICD-10-CM | POA: Diagnosis not present

## 2011-06-16 ENCOUNTER — Other Ambulatory Visit: Payer: Self-pay | Admitting: Endocrinology

## 2011-06-16 DIAGNOSIS — Z1231 Encounter for screening mammogram for malignant neoplasm of breast: Secondary | ICD-10-CM

## 2011-06-28 ENCOUNTER — Ambulatory Visit
Admission: RE | Admit: 2011-06-28 | Discharge: 2011-06-28 | Disposition: A | Discharge status: Home / Self Care | Payer: Medicare Other | Source: Ambulatory Visit | Attending: Endocrinology | Admitting: Endocrinology

## 2011-06-28 DIAGNOSIS — Z1231 Encounter for screening mammogram for malignant neoplasm of breast: Secondary | ICD-10-CM

## 2011-07-08 DIAGNOSIS — M79609 Pain in unspecified limb: Secondary | ICD-10-CM | POA: Diagnosis not present

## 2011-07-08 DIAGNOSIS — E109 Type 1 diabetes mellitus without complications: Secondary | ICD-10-CM | POA: Diagnosis not present

## 2011-07-08 DIAGNOSIS — B351 Tinea unguium: Secondary | ICD-10-CM | POA: Diagnosis not present

## 2011-07-12 DIAGNOSIS — R9431 Abnormal electrocardiogram [ECG] [EKG]: Secondary | ICD-10-CM | POA: Diagnosis not present

## 2011-07-12 DIAGNOSIS — R0609 Other forms of dyspnea: Secondary | ICD-10-CM | POA: Diagnosis not present

## 2011-07-12 DIAGNOSIS — R011 Cardiac murmur, unspecified: Secondary | ICD-10-CM | POA: Diagnosis not present

## 2011-07-22 DIAGNOSIS — R0602 Shortness of breath: Secondary | ICD-10-CM | POA: Diagnosis not present

## 2011-07-22 DIAGNOSIS — I10 Essential (primary) hypertension: Secondary | ICD-10-CM | POA: Diagnosis not present

## 2011-07-22 DIAGNOSIS — R0609 Other forms of dyspnea: Secondary | ICD-10-CM | POA: Diagnosis not present

## 2011-07-22 DIAGNOSIS — Z9289 Personal history of other medical treatment: Secondary | ICD-10-CM

## 2011-07-22 DIAGNOSIS — E782 Mixed hyperlipidemia: Secondary | ICD-10-CM | POA: Diagnosis not present

## 2011-07-22 DIAGNOSIS — R9431 Abnormal electrocardiogram [ECG] [EKG]: Secondary | ICD-10-CM | POA: Diagnosis not present

## 2011-07-22 HISTORY — PX: TRANSTHORACIC ECHOCARDIOGRAM: SHX275

## 2011-07-22 HISTORY — DX: Personal history of other medical treatment: Z92.89

## 2011-08-08 DIAGNOSIS — I10 Essential (primary) hypertension: Secondary | ICD-10-CM | POA: Diagnosis not present

## 2011-08-08 DIAGNOSIS — G4730 Sleep apnea, unspecified: Secondary | ICD-10-CM

## 2011-08-08 HISTORY — DX: Sleep apnea, unspecified: G47.30

## 2011-08-09 DIAGNOSIS — G4730 Sleep apnea, unspecified: Secondary | ICD-10-CM | POA: Diagnosis not present

## 2011-08-09 DIAGNOSIS — R0989 Other specified symptoms and signs involving the circulatory and respiratory systems: Secondary | ICD-10-CM | POA: Diagnosis not present

## 2011-08-09 DIAGNOSIS — G4733 Obstructive sleep apnea (adult) (pediatric): Secondary | ICD-10-CM | POA: Diagnosis not present

## 2011-08-09 DIAGNOSIS — G4710 Hypersomnia, unspecified: Secondary | ICD-10-CM | POA: Diagnosis not present

## 2011-08-09 DIAGNOSIS — R0609 Other forms of dyspnea: Secondary | ICD-10-CM | POA: Diagnosis not present

## 2011-08-22 DIAGNOSIS — R0609 Other forms of dyspnea: Secondary | ICD-10-CM | POA: Diagnosis not present

## 2011-08-22 DIAGNOSIS — R0989 Other specified symptoms and signs involving the circulatory and respiratory systems: Secondary | ICD-10-CM | POA: Diagnosis not present

## 2011-08-22 DIAGNOSIS — Z79899 Other long term (current) drug therapy: Secondary | ICD-10-CM | POA: Diagnosis not present

## 2011-08-22 DIAGNOSIS — I10 Essential (primary) hypertension: Secondary | ICD-10-CM | POA: Diagnosis not present

## 2011-08-22 DIAGNOSIS — R0602 Shortness of breath: Secondary | ICD-10-CM | POA: Diagnosis not present

## 2011-09-16 DIAGNOSIS — Z79899 Other long term (current) drug therapy: Secondary | ICD-10-CM | POA: Diagnosis not present

## 2011-09-22 DIAGNOSIS — E789 Disorder of lipoprotein metabolism, unspecified: Secondary | ICD-10-CM | POA: Diagnosis not present

## 2011-09-22 DIAGNOSIS — I6350 Cerebral infarction due to unspecified occlusion or stenosis of unspecified cerebral artery: Secondary | ICD-10-CM | POA: Diagnosis not present

## 2011-09-22 DIAGNOSIS — E119 Type 2 diabetes mellitus without complications: Secondary | ICD-10-CM | POA: Diagnosis not present

## 2011-09-22 DIAGNOSIS — R197 Diarrhea, unspecified: Secondary | ICD-10-CM | POA: Diagnosis not present

## 2011-11-09 ENCOUNTER — Encounter: Payer: Self-pay | Admitting: Physical Medicine & Rehabilitation

## 2011-11-09 ENCOUNTER — Encounter: Payer: Medicare Other | Attending: Physical Medicine & Rehabilitation | Admitting: Physical Medicine & Rehabilitation

## 2011-11-09 VITALS — BP 165/75 | HR 72 | Resp 16 | Ht 66.0 in | Wt 278.2 lb

## 2011-11-09 DIAGNOSIS — E785 Hyperlipidemia, unspecified: Secondary | ICD-10-CM | POA: Insufficient documentation

## 2011-11-09 DIAGNOSIS — M1710 Unilateral primary osteoarthritis, unspecified knee: Secondary | ICD-10-CM | POA: Insufficient documentation

## 2011-11-09 DIAGNOSIS — I619 Nontraumatic intracerebral hemorrhage, unspecified: Secondary | ICD-10-CM | POA: Diagnosis not present

## 2011-11-09 DIAGNOSIS — E119 Type 2 diabetes mellitus without complications: Secondary | ICD-10-CM | POA: Diagnosis not present

## 2011-11-09 DIAGNOSIS — I10 Essential (primary) hypertension: Secondary | ICD-10-CM | POA: Insufficient documentation

## 2011-11-09 DIAGNOSIS — E669 Obesity, unspecified: Secondary | ICD-10-CM

## 2011-11-09 DIAGNOSIS — I69998 Other sequelae following unspecified cerebrovascular disease: Secondary | ICD-10-CM | POA: Insufficient documentation

## 2011-11-09 DIAGNOSIS — M1711 Unilateral primary osteoarthritis, right knee: Secondary | ICD-10-CM | POA: Insufficient documentation

## 2011-11-09 NOTE — Progress Notes (Signed)
Subjective:    Patient ID: Robin Foley, female    DOB: [birth year], 56 y.o.   MRN: [id number]  HPI  Robin Foley is back regarding her right mid brain hemorrhage and mobility/powered scooter evaluation.. She tells me that over the last year things have not changed traumatically. He has had ongoing problems with balance and stability. She's has fallen multiple times at home despite using her rolling walker. She finds it quite difficult to get out of the house and go shopping or anywhere in longer distances required also. We will CT her mobility needs cannot be met safely by using her walker or cane or a manual wheelchair. Her size precludes her from being able to propel a manual wheelchair. She has been on her walker for the last 2 or 3 years. She is able to transfer from different surfaces within the house without substantial difficulties. Her knees continue to be problems. She had more right knee pain when last but more recently the left knee has been an issue. Her endurance is quite limited. She can only walk about 30-50 feet before she needs to stop and rest. Although she is able to transfer she does find it more difficult to move in her house using her walker.   Pain Inventory Average Pain 0 Pain Right Now 0 My pain is no pain  In the last 24 hours, has pain interfered with the following? General activity 0 Relation with others 0 Enjoyment of life 0 What TIME of day is your pain at its worst? no pain Sleep (in general) Good  Pain is worse with: walking, bending and standing Pain improves with: rest Relief from Meds: no pain  Mobility use a cane use a walker how many minutes can you walk? 5 ability to climb steps?  yes do you drive?  no use a wheelchair needs help with transfers  Function disabled: date disabled 2006 I need assistance with the following:  meal prep, household duties and shopping  Neuro/Psych trouble walking  Prior Studies Any changes since last  visit?  no  Physicians involved in your care Any changes since last visit?  no   Family History  Problem Relation Age of Onset  . Diabetes Mother   . ALS Mother   . Cancer Sister    History   Social History  . Marital Status: Married    Spouse Name: N/A    Number of Children: N/A  . Years of Education: N/A   Social History Main Topics  . Smoking status: Former Smoker    Quit date: 05/11/2004  . Smokeless tobacco: Never Used  . Alcohol Use: None  . Drug Use: None  . Sexually Active: None   Other Topics Concern  . None   Social History Narrative  . None   Past Surgical History  Procedure Date  . Breast surgery   . Abdominal hysterectomy    Past Medical History  Diagnosis Date  . Stroke   . Hypertension   . Hyperlipidemia   . Diabetes mellitus    BP 165/75  Pulse 72  Resp 16  Ht 5\' 6"  (1.676 m)  Wt 278 lb 3.2 oz (126.191 kg)  BMI 44.90 kg/m2  SpO2 92%   Review of Systems  Constitutional: Positive for unexpected weight change.       High blood sugars  Respiratory: Positive for apnea.   Musculoskeletal: Positive for gait problem.  All other systems reviewed and are negative.  Objective:   Physical Exam  Constitutional: She is oriented to person, place, and time. She appears well-developed and well-nourished.  HENT:  Head: Normocephalic and atraumatic.  Right Ear: External ear normal.  Left Ear: External ear normal.  Mouth/Throat: Oropharynx is clear and moist.  Eyes: Conjunctivae and EOM are normal. Pupils are equal, round, and reactive to light.  Neck: Normal range of motion.  Cardiovascular: Normal rate and regular rhythm.   Pulmonary/Chest: Effort normal and breath sounds normal.  Abdominal: Soft. Bowel sounds are normal.  Neurological: She is alert and oriented to person, place, and time.  Psychiatric: She has a normal mood and affect. Her behavior is normal. Judgment and thought content normal.   patient remains morbidly obese. She  stood for me today with extra time and needed support for balance. Once standing she has a wide-based gait. She seemed to have more difficulty advancing the right leg today than the left. There is significant valgus deformity at the left knee. She's he is able to change directions. Strength is grossly 4/5 in the upper extremities proximal to distal. Lower extremity strength is 3+ proximal to 4/5 distally. She has diminished fine motor coordination in the left greater than right limbs. Speech is quite dysarthric. Control is diminished. Otherwise cranial nerve exam is intact. Cognitively she has fair insight and awareness. Memory is generally appropriate.  The patient does display crepitus in the left knee with extension and flexion. No frank knee instability is seen.        Assessment & Plan:  1. Right Mid Brain Hemorrhage 2. Morbid Obesity 3  OA of the right knee 4. Hx of costochondritis of the right 4th and 5th ribs.  Plan: 1. We will complete extensive documentation for a powered scooter. Given her past medical history and current problems I think a powered scooter he was justified. 2. Again discussed blood pressure management and weight loss. Obviously given her balance and stability issues, aerobic exercises somewhat difficult. 3. I will see the patient back in about one years time. All questions were encouraged and answered.

## 2011-11-09 NOTE — Patient Instructions (Signed)
IF YOU DON'T HEAR FROM THE [employer] IN ABOUT 2 WEEKS, GIVE THEM A CALL TO MAKE SURE THEY RECEIVED YOUR PAPERWORK.

## 2011-11-25 DIAGNOSIS — I10 Essential (primary) hypertension: Secondary | ICD-10-CM | POA: Diagnosis not present

## 2011-11-25 DIAGNOSIS — G4733 Obstructive sleep apnea (adult) (pediatric): Secondary | ICD-10-CM | POA: Diagnosis not present

## 2012-01-17 ENCOUNTER — Encounter
Payer: Medicare Other | Attending: Physical Medicine and Rehabilitation | Admitting: Physical Medicine and Rehabilitation

## 2012-01-17 ENCOUNTER — Encounter: Payer: Medicare Other | Admitting: Physical Medicine & Rehabilitation

## 2012-01-17 ENCOUNTER — Encounter: Payer: Self-pay | Admitting: Physical Medicine and Rehabilitation

## 2012-01-17 VITALS — BP 142/48 | HR 70 | Resp 16 | Ht 67.0 in | Wt 277.0 lb

## 2012-01-17 DIAGNOSIS — R0681 Apnea, not elsewhere classified: Secondary | ICD-10-CM | POA: Diagnosis not present

## 2012-01-17 DIAGNOSIS — M1710 Unilateral primary osteoarthritis, unspecified knee: Secondary | ICD-10-CM | POA: Diagnosis not present

## 2012-01-17 DIAGNOSIS — I6350 Cerebral infarction due to unspecified occlusion or stenosis of unspecified cerebral artery: Secondary | ICD-10-CM

## 2012-01-17 DIAGNOSIS — R350 Frequency of micturition: Secondary | ICD-10-CM | POA: Insufficient documentation

## 2012-01-17 DIAGNOSIS — I69993 Ataxia following unspecified cerebrovascular disease: Secondary | ICD-10-CM | POA: Diagnosis not present

## 2012-01-17 DIAGNOSIS — I619 Nontraumatic intracerebral hemorrhage, unspecified: Secondary | ICD-10-CM | POA: Diagnosis not present

## 2012-01-17 DIAGNOSIS — R279 Unspecified lack of coordination: Secondary | ICD-10-CM | POA: Diagnosis not present

## 2012-01-17 DIAGNOSIS — I639 Cerebral infarction, unspecified: Secondary | ICD-10-CM

## 2012-01-17 DIAGNOSIS — R509 Fever, unspecified: Secondary | ICD-10-CM | POA: Diagnosis not present

## 2012-01-17 DIAGNOSIS — M2550 Pain in unspecified joint: Secondary | ICD-10-CM | POA: Diagnosis not present

## 2012-01-17 DIAGNOSIS — Z9181 History of falling: Secondary | ICD-10-CM | POA: Diagnosis not present

## 2012-01-17 DIAGNOSIS — R269 Unspecified abnormalities of gait and mobility: Secondary | ICD-10-CM | POA: Diagnosis not present

## 2012-01-17 DIAGNOSIS — IMO0001 Reserved for inherently not codable concepts without codable children: Secondary | ICD-10-CM | POA: Diagnosis not present

## 2012-01-17 NOTE — Patient Instructions (Signed)
Stay as active as possible

## 2012-01-17 NOTE — Progress Notes (Signed)
Subjective:    Patient ID: Robin Foley, female    DOB: [birth year], 56 y.o.   MRN: [id number]  HPI Robin Foley is back regarding her right mid brain hemorrhage and to fill out disability forms.  She tells me that over the last year things have not changed traumatically, but she  has had ongoing problems with balance and stability. She's has fallen multiple times at home despite using her rolling walker. She finds it quite difficult to get out of the house and go shopping or anywhere in longer distances required also. Her endurance is quite limited. She can only walk about 30-50 feet before she needs to stop and rest. Although she is able to transfer she does find it more difficult to move in her house using her walker. The patient also reports, that she has intermittend double vision since her stroke, and that she does not drive.  Pain Inventory Average Pain 0 Pain Right Now 0 My pain is intermittent  In the last 24 hours, has pain interfered with the following? General activity 0 Relation with others 0 Enjoyment of life 0 What TIME of day is your pain at its worst? morning Sleep (in general) Good  Pain is worse with: walking Pain improves with: rest Relief from Meds: 0  Mobility walk without assistance walk with assistance use a cane use a walker ability to climb steps?  yes do you drive?  no use a wheelchair Do you have any goals in this area?  yes  Function disabled: date disabled 05/2004 I need assistance with the following:  meal prep, household duties and shopping  Neuro/Psych bladder control problems trouble walking  Prior Studies Any changes since last visit?  no  Physicians involved in your care Any changes since last visit?  no   Family History  Problem Relation Age of Onset  . Diabetes Mother   . ALS Mother   . Cancer Sister    History   Social History  . Marital Status: Married    Spouse Name: N/A    Number of Children: N/A  . Years  of Education: N/A   Social History Main Topics  . Smoking status: Former Smoker    Quit date: 05/11/2004  . Smokeless tobacco: Never Used  . Alcohol Use: None  . Drug Use: None  . Sexually Active: None   Other Topics Concern  . None   Social History Narrative  . None   Past Surgical History  Procedure Date  . Breast surgery   . Abdominal hysterectomy    Past Medical History  Diagnosis Date  . Stroke   . Hypertension   . Hyperlipidemia   . Diabetes mellitus    BP 142/48  Pulse 70  Resp 16  Ht 5\' 7"  (1.702 m)  Wt 277 lb (125.646 kg)  BMI 43.38 kg/m2  SpO2 96%     Review of Systems  Constitutional: Positive for fever and unexpected weight change.  Respiratory: Positive for apnea.   Genitourinary: Positive for frequency.  Musculoskeletal: Positive for myalgias, arthralgias and gait problem.  All other systems reviewed and are negative.       Objective:   Physical Exam Constitutional: She is oriented to person, place, and time. She appears well-developed and well-nourished.  HENT:  Head: Normocephalic and atraumatic.  Right Ear: External ear normal.  Left Ear: External ear normal.  Mouth/Throat: Oropharynx is clear and moist.  Eyes: Conjunctivae and EOM are normal. Pupils are equal, round, and  reactive to light.  Neck: Normal range of motion.  Cardiovascular: Normal rate and regular rhythm.  Pulmonary/Chest: Effort normal and breath sounds normal.  Abdominal: Soft. Bowel sounds are normal.  Neurological: She is alert and oriented to person, place, and time.  Psychiatric: She has a normal mood and affect. Her behavior is normal. Judgment and thought content normal.  patient remains morbidly obese. She has a wide-based gait. She seemed to have more difficulty advancing the right leg today than the left. There is significant valgus deformity at the left knee. She's he is able to change directions. Strength is grossly 4/5 in the upper extremities proximal to  distal. Lower extremity strength is 3+ proximal to 4/5 distally. She has diminished fine motor coordination in the left greater than right limbs. Speech is quite dysarthric. Control is diminished. Otherwise cranial nerve exam is intact. Cognitively she has fair insight and awareness.  The patient does display crepitus in the left knee with extension and flexion. No frank knee instability is seen. DTR 1+ UEs, LEs        Assessment & Plan:  1. Right Mid Brain Hemorrhage  2. Morbid Obesity  3 OA of the right knee  4. Hx of costochondritis of the right 4th and 5th ribs.  Plan:  1. Filled out disability papers. Given her past medical history and current problems I think receiving disability is justified.  2. Again discussed blood pressure management and weight loss. Obviously given her balance and stability issues, aerobic exercises somewhat difficult.  3. I will see the patient back in about one years time. All questions were encouraged and answered.

## 2012-01-18 DIAGNOSIS — E119 Type 2 diabetes mellitus without complications: Secondary | ICD-10-CM | POA: Diagnosis not present

## 2012-01-18 DIAGNOSIS — E789 Disorder of lipoprotein metabolism, unspecified: Secondary | ICD-10-CM | POA: Diagnosis not present

## 2012-02-10 DIAGNOSIS — E789 Disorder of lipoprotein metabolism, unspecified: Secondary | ICD-10-CM | POA: Diagnosis not present

## 2012-02-10 DIAGNOSIS — I10 Essential (primary) hypertension: Secondary | ICD-10-CM | POA: Diagnosis not present

## 2012-02-10 DIAGNOSIS — E119 Type 2 diabetes mellitus without complications: Secondary | ICD-10-CM | POA: Diagnosis not present

## 2012-02-10 DIAGNOSIS — Z23 Encounter for immunization: Secondary | ICD-10-CM | POA: Diagnosis not present

## 2012-02-10 DIAGNOSIS — I6789 Other cerebrovascular disease: Secondary | ICD-10-CM | POA: Diagnosis not present

## 2012-02-22 DIAGNOSIS — R0602 Shortness of breath: Secondary | ICD-10-CM | POA: Diagnosis not present

## 2012-02-22 DIAGNOSIS — I10 Essential (primary) hypertension: Secondary | ICD-10-CM | POA: Diagnosis not present

## 2012-02-22 DIAGNOSIS — Z79899 Other long term (current) drug therapy: Secondary | ICD-10-CM | POA: Diagnosis not present

## 2012-02-22 DIAGNOSIS — E119 Type 2 diabetes mellitus without complications: Secondary | ICD-10-CM | POA: Diagnosis not present

## 2012-02-24 DIAGNOSIS — I6789 Other cerebrovascular disease: Secondary | ICD-10-CM | POA: Diagnosis not present

## 2012-02-24 DIAGNOSIS — I10 Essential (primary) hypertension: Secondary | ICD-10-CM | POA: Diagnosis not present

## 2012-02-24 DIAGNOSIS — E789 Disorder of lipoprotein metabolism, unspecified: Secondary | ICD-10-CM | POA: Diagnosis not present

## 2012-02-24 DIAGNOSIS — E119 Type 2 diabetes mellitus without complications: Secondary | ICD-10-CM | POA: Diagnosis not present

## 2012-03-02 DIAGNOSIS — E119 Type 2 diabetes mellitus without complications: Secondary | ICD-10-CM | POA: Diagnosis not present

## 2012-03-02 DIAGNOSIS — I10 Essential (primary) hypertension: Secondary | ICD-10-CM | POA: Diagnosis not present

## 2012-03-06 DIAGNOSIS — E119 Type 2 diabetes mellitus without complications: Secondary | ICD-10-CM | POA: Diagnosis not present

## 2012-03-13 DIAGNOSIS — I6789 Other cerebrovascular disease: Secondary | ICD-10-CM | POA: Diagnosis not present

## 2012-03-13 DIAGNOSIS — I10 Essential (primary) hypertension: Secondary | ICD-10-CM | POA: Diagnosis not present

## 2012-03-13 DIAGNOSIS — E119 Type 2 diabetes mellitus without complications: Secondary | ICD-10-CM | POA: Diagnosis not present

## 2012-04-03 DIAGNOSIS — E119 Type 2 diabetes mellitus without complications: Secondary | ICD-10-CM | POA: Diagnosis not present

## 2012-04-11 DIAGNOSIS — I10 Essential (primary) hypertension: Secondary | ICD-10-CM | POA: Diagnosis not present

## 2012-04-11 DIAGNOSIS — E119 Type 2 diabetes mellitus without complications: Secondary | ICD-10-CM | POA: Diagnosis not present

## 2012-04-11 DIAGNOSIS — E789 Disorder of lipoprotein metabolism, unspecified: Secondary | ICD-10-CM | POA: Diagnosis not present

## 2012-04-11 DIAGNOSIS — I6789 Other cerebrovascular disease: Secondary | ICD-10-CM | POA: Diagnosis not present

## 2012-04-17 ENCOUNTER — Encounter: Payer: Medicare Other | Admitting: Physical Medicine and Rehabilitation

## 2012-08-30 DIAGNOSIS — H538 Other visual disturbances: Secondary | ICD-10-CM | POA: Diagnosis not present

## 2012-08-30 DIAGNOSIS — Z79899 Other long term (current) drug therapy: Secondary | ICD-10-CM | POA: Diagnosis not present

## 2012-08-30 DIAGNOSIS — E119 Type 2 diabetes mellitus without complications: Secondary | ICD-10-CM | POA: Diagnosis not present

## 2012-08-30 DIAGNOSIS — H43399 Other vitreous opacities, unspecified eye: Secondary | ICD-10-CM | POA: Diagnosis not present

## 2012-08-30 DIAGNOSIS — H04129 Dry eye syndrome of unspecified lacrimal gland: Secondary | ICD-10-CM | POA: Diagnosis not present

## 2012-08-30 DIAGNOSIS — H2510 Age-related nuclear cataract, unspecified eye: Secondary | ICD-10-CM | POA: Diagnosis not present

## 2012-08-30 DIAGNOSIS — I10 Essential (primary) hypertension: Secondary | ICD-10-CM | POA: Diagnosis not present

## 2012-08-30 DIAGNOSIS — E789 Disorder of lipoprotein metabolism, unspecified: Secondary | ICD-10-CM | POA: Diagnosis not present

## 2012-09-06 DIAGNOSIS — E789 Disorder of lipoprotein metabolism, unspecified: Secondary | ICD-10-CM | POA: Diagnosis not present

## 2012-09-06 DIAGNOSIS — I10 Essential (primary) hypertension: Secondary | ICD-10-CM | POA: Diagnosis not present

## 2012-09-06 DIAGNOSIS — N189 Chronic kidney disease, unspecified: Secondary | ICD-10-CM | POA: Diagnosis not present

## 2012-09-06 DIAGNOSIS — E119 Type 2 diabetes mellitus without complications: Secondary | ICD-10-CM | POA: Diagnosis not present

## 2012-09-07 ENCOUNTER — Other Ambulatory Visit: Payer: Self-pay | Admitting: Endocrinology

## 2012-09-07 DIAGNOSIS — N19 Unspecified kidney failure: Secondary | ICD-10-CM

## 2012-09-11 ENCOUNTER — Ambulatory Visit
Admission: RE | Admit: 2012-09-11 | Discharge: 2012-09-11 | Disposition: A | Discharge status: Home / Self Care | Payer: Medicare Other | Source: Ambulatory Visit | Attending: Endocrinology | Admitting: Endocrinology

## 2012-09-11 DIAGNOSIS — N19 Unspecified kidney failure: Secondary | ICD-10-CM | POA: Diagnosis not present

## 2012-10-03 DIAGNOSIS — E789 Disorder of lipoprotein metabolism, unspecified: Secondary | ICD-10-CM | POA: Diagnosis not present

## 2012-10-03 DIAGNOSIS — N189 Chronic kidney disease, unspecified: Secondary | ICD-10-CM | POA: Diagnosis not present

## 2012-10-05 DIAGNOSIS — N058 Unspecified nephritic syndrome with other morphologic changes: Secondary | ICD-10-CM | POA: Diagnosis not present

## 2012-10-05 DIAGNOSIS — N179 Acute kidney failure, unspecified: Secondary | ICD-10-CM | POA: Diagnosis not present

## 2012-10-05 DIAGNOSIS — I10 Essential (primary) hypertension: Secondary | ICD-10-CM | POA: Diagnosis not present

## 2012-10-05 DIAGNOSIS — E119 Type 2 diabetes mellitus without complications: Secondary | ICD-10-CM | POA: Diagnosis not present

## 2012-10-10 DIAGNOSIS — I10 Essential (primary) hypertension: Secondary | ICD-10-CM | POA: Diagnosis not present

## 2012-10-10 DIAGNOSIS — E789 Disorder of lipoprotein metabolism, unspecified: Secondary | ICD-10-CM | POA: Diagnosis not present

## 2012-10-10 DIAGNOSIS — I6789 Other cerebrovascular disease: Secondary | ICD-10-CM | POA: Diagnosis not present

## 2012-10-10 DIAGNOSIS — E119 Type 2 diabetes mellitus without complications: Secondary | ICD-10-CM | POA: Diagnosis not present

## 2012-10-22 DIAGNOSIS — N179 Acute kidney failure, unspecified: Secondary | ICD-10-CM | POA: Diagnosis not present

## 2012-10-25 DIAGNOSIS — I6789 Other cerebrovascular disease: Secondary | ICD-10-CM | POA: Diagnosis not present

## 2012-10-25 DIAGNOSIS — N189 Chronic kidney disease, unspecified: Secondary | ICD-10-CM | POA: Diagnosis not present

## 2012-10-25 DIAGNOSIS — I10 Essential (primary) hypertension: Secondary | ICD-10-CM | POA: Diagnosis not present

## 2012-10-25 DIAGNOSIS — E119 Type 2 diabetes mellitus without complications: Secondary | ICD-10-CM | POA: Diagnosis not present

## 2012-11-12 ENCOUNTER — Ambulatory Visit: Payer: Medicare Other | Admitting: Physical Medicine & Rehabilitation

## 2012-11-12 DIAGNOSIS — N179 Acute kidney failure, unspecified: Secondary | ICD-10-CM | POA: Diagnosis not present

## 2012-11-12 DIAGNOSIS — E119 Type 2 diabetes mellitus without complications: Secondary | ICD-10-CM | POA: Diagnosis not present

## 2012-11-15 DIAGNOSIS — E119 Type 2 diabetes mellitus without complications: Secondary | ICD-10-CM | POA: Diagnosis not present

## 2012-11-15 DIAGNOSIS — R079 Chest pain, unspecified: Secondary | ICD-10-CM | POA: Diagnosis not present

## 2012-11-15 DIAGNOSIS — IMO0002 Reserved for concepts with insufficient information to code with codable children: Secondary | ICD-10-CM | POA: Diagnosis not present

## 2012-11-15 DIAGNOSIS — E789 Disorder of lipoprotein metabolism, unspecified: Secondary | ICD-10-CM | POA: Diagnosis not present

## 2012-11-15 DIAGNOSIS — N189 Chronic kidney disease, unspecified: Secondary | ICD-10-CM | POA: Diagnosis not present

## 2012-11-16 DIAGNOSIS — IMO0002 Reserved for concepts with insufficient information to code with codable children: Secondary | ICD-10-CM | POA: Diagnosis not present

## 2012-12-04 ENCOUNTER — Encounter: Payer: Self-pay | Admitting: *Deleted

## 2012-12-04 ENCOUNTER — Encounter: Payer: Medicare Other | Attending: Endocrinology | Admitting: *Deleted

## 2012-12-04 VITALS — Ht 66.0 in | Wt 252.0 lb

## 2012-12-04 DIAGNOSIS — I10 Essential (primary) hypertension: Secondary | ICD-10-CM | POA: Insufficient documentation

## 2012-12-04 DIAGNOSIS — E119 Type 2 diabetes mellitus without complications: Secondary | ICD-10-CM | POA: Insufficient documentation

## 2012-12-04 DIAGNOSIS — Z713 Dietary counseling and surveillance: Secondary | ICD-10-CM | POA: Insufficient documentation

## 2012-12-04 NOTE — Progress Notes (Signed)
  Medical Nutrition Therapy:  Appt start time: 1100 end time:  1200.  Assessment:  Primary concerns today: patient here for diabetes education and Carb Counting. Her aunt Hassan Rowan brought her to this appointment as patient does not drive. Lives with husband and 2 children ages 106 and 19. Husband shops and prepares the meals. SMBG twice a day before breakfast and bedtime. Reported range of 80-200 mg/dl. She used to exercise on treadmill at home every other day but fell recently and has not resumed yet. States low BG maybe once a month.  MEDICATIONS: see list, diabetes medication includes    DIETARY INTAKE:  Usual eating pattern includes 2 meals and 2-3 snacks per day.  Everyday foods include good variety of all food groups.  Avoided foods include fried foods, milk due to lactose intolerance.    24-hr recall:  B (12 PM): fresh fruit  Snk ( AM): none  L (2 PM): cereal with  OR bacon and tomato sandwich with fresh fruit with water Snk ( PM): yes- graham crackers OR corn or potato chips out of the container D ( PM): meat, starch and lots of vegetables, water Snk ( PM): 2-3 pieces of individual chocolate candy and some grapes Beverages: water, occasionally sweet tea, regular coke for low BG  Usual physical activity: used to walk on treadmill every other day  Estimated energy needs: 1400 calories 158 g carbohydrates 105 g protein 39 g fat  Progress Towards Goal(s):  In progress.   Nutritional Diagnosis:  NB-1.1 Food and nutrition-related knowledge deficit As related to diabetes management.  As evidenced by obesity and BMI of 40.8    Intervention:  Nutrition counseling and diabetes education initiated. Discussed basic physiology of diabetes, SMBG and rationale of checking BG at alternate times of day, A1c, Carb Counting, and benefits of increased activity including possibility of Arm Chair exercises. Also discussed insulin action of her Lantus and Humalog and importance of providing  consistent carb intake to prevent hypo and hyperglycemia. Patient expressed good verbal understanding of carb counting and insulin action.  Handouts given during visit include: Living Well with Diabetes Carb Counting and Food Label handouts Meal Plan Card Insulin Action handout  Monitoring/Evaluation:  Dietary intake, exercise, consistent carb meals, and body weight in 2 month(s).

## 2012-12-14 ENCOUNTER — Encounter: Payer: Self-pay | Admitting: Physical Medicine and Rehabilitation

## 2012-12-14 ENCOUNTER — Encounter
Payer: Medicare Other | Attending: Physical Medicine and Rehabilitation | Admitting: Physical Medicine and Rehabilitation

## 2012-12-14 VITALS — BP 154/66 | HR 71 | Resp 16 | Ht 67.0 in | Wt 256.0 lb

## 2012-12-14 DIAGNOSIS — E119 Type 2 diabetes mellitus without complications: Secondary | ICD-10-CM | POA: Diagnosis not present

## 2012-12-14 DIAGNOSIS — H539 Unspecified visual disturbance: Secondary | ICD-10-CM | POA: Diagnosis not present

## 2012-12-14 DIAGNOSIS — I69998 Other sequelae following unspecified cerebrovascular disease: Secondary | ICD-10-CM | POA: Insufficient documentation

## 2012-12-14 DIAGNOSIS — E785 Hyperlipidemia, unspecified: Secondary | ICD-10-CM | POA: Insufficient documentation

## 2012-12-14 DIAGNOSIS — N189 Chronic kidney disease, unspecified: Secondary | ICD-10-CM | POA: Diagnosis not present

## 2012-12-14 DIAGNOSIS — I619 Nontraumatic intracerebral hemorrhage, unspecified: Secondary | ICD-10-CM

## 2012-12-14 DIAGNOSIS — M1710 Unilateral primary osteoarthritis, unspecified knee: Secondary | ICD-10-CM | POA: Insufficient documentation

## 2012-12-14 DIAGNOSIS — M170 Bilateral primary osteoarthritis of knee: Secondary | ICD-10-CM

## 2012-12-14 DIAGNOSIS — I129 Hypertensive chronic kidney disease with stage 1 through stage 4 chronic kidney disease, or unspecified chronic kidney disease: Secondary | ICD-10-CM | POA: Diagnosis not present

## 2012-12-14 NOTE — Progress Notes (Signed)
Subjective:    Patient ID: Robin Foley, female    DOB: 08/25/1955, 57 y.o.   MRN: [id number]  HPI Robin Foley is back regarding her right mid brain hemorrhage and to fill out disability forms. She tells me that over the last year things have not changed traumatically, but her problems with balance and stability, have improved slightly, she states, that she walks on a treadmill 1/2 a mile almost every day. She's has fallen 1-2 times last year at home, despite using her rolling walker. She finds it quite difficult to get out of the house and go shopping or anywhere in longer distances required also. Her endurance is quite limited.Although she is able to transfer she does find it more difficult to move in her house using her walker. The patient also reports, that she has intermittend double vision since her stroke, and that she does not drive. She has lost 21 lbs since the last visit.  Pain Inventory Average Pain 0 Pain Right Now 0 My pain is n/a  In the last 24 hours, has pain interfered with the following? General activity 0 Relation with others 0 Enjoyment of life 0 What TIME of day is your pain at its worst? n/a Sleep (in general) Good  Pain is worse with: n/a Pain improves with: n/a Relief from Meds: n/a  Mobility use a walker how many minutes can you walk? 5 ability to climb steps?  yes do you drive?  no  Function disabled: date disabled 1/06 I need assistance with the following:  meal prep, household duties and shopping  Neuro/Psych bladder control problems weakness trouble walking  Prior Studies Any changes since last visit?  no  Physicians involved in your care Any changes since last visit?  no Dr Fabiola Backer   Family History  Problem Relation Age of Onset  . Diabetes Mother   . ALS Mother   . Cancer Sister    History   Social History  . Marital Status: Married    Spouse Name: N/A    Number of Children: N/A  . Years of Education: N/A   Social  History Main Topics  . Smoking status: Former Smoker    Quit date: 05/11/2004  . Smokeless tobacco: Never Used  . Alcohol Use: None  . Drug Use: None  . Sexually Active: None   Other Topics Concern  . None   Social History Narrative  . None   Past Surgical History  Procedure Laterality Date  . Breast surgery    . Abdominal hysterectomy     Past Medical History  Diagnosis Date  . Stroke   . Hypertension   . Hyperlipidemia   . Diabetes mellitus   . Chronic kidney disease    BP 154/66  Pulse 71  Resp 16  Ht 5\' 7"  (1.702 m)  Wt 256 lb (116.121 kg)  BMI 40.09 kg/m2  SpO2 97%     Review of Systems  Genitourinary: Positive for difficulty urinating.  Musculoskeletal: Positive for gait problem.  Neurological: Positive for weakness.  All other systems reviewed and are negative.       Objective:   Physical Exam Constitutional: She is oriented to person, place, and time. She appears well-developed and well-nourished.  HENT:  Head: Normocephalic and atraumatic.    Neck: Normal range of motion.   Neurological: She is alert and oriented to person, place, and time.  Psychiatric: She has a normal mood and affect. Her behavior is normal. Judgment and thought  content normal.  patient remains morbidly obese. She has a wide-based gait. She seemed to have more difficulty advancing the right leg today than the left. There is significant valgus deformity at the left knee. She's he is able to change directions. Strength is grossly 5/5 in the upper extremities proximal to distal. Lower extremity strength is 4 proximal to 5/5 distally. She has diminished fine motor coordination in the left greater than right Hand. Speech is quite dysarthric. Control is diminished. Otherwise cranial nerve exam is intact. Cognitively she has fair insight and awareness.  The patient does display crepitus in the left knee with extension and flexion. No frank knee instability is seen.  DTR 1+ UEs,  LEs        Assessment & Plan:  1. Right Mid Brain Hemorrhage  2. Morbid Obesity  3 OA of  knees  4. Hx of costochondritis of the right 4th and 5th ribs.  Plan:  1. Filled out disability papers. Given her past medical history and current problems I think receiving disability is justified.  2. Again discussed blood pressure management and weight loss. Obviously given her balance and stability issues, aerobic exercises somewhat difficult.Ordered aquatic PT, to further improve her strength and balance, without putting to much weight on her joints.  3. I will see the patient back in about one years time. All questions were encouraged and answered. Spent 25 min with patient face to face

## 2012-12-14 NOTE — Patient Instructions (Signed)
Continue with your walking program, as tolerated and as it is safe.

## 2013-01-03 DIAGNOSIS — M1710 Unilateral primary osteoarthritis, unspecified knee: Secondary | ICD-10-CM | POA: Diagnosis not present

## 2013-01-03 DIAGNOSIS — I619 Nontraumatic intracerebral hemorrhage, unspecified: Secondary | ICD-10-CM | POA: Diagnosis not present

## 2013-01-04 DIAGNOSIS — M1710 Unilateral primary osteoarthritis, unspecified knee: Secondary | ICD-10-CM | POA: Diagnosis not present

## 2013-01-04 DIAGNOSIS — I619 Nontraumatic intracerebral hemorrhage, unspecified: Secondary | ICD-10-CM | POA: Diagnosis not present

## 2013-01-10 DIAGNOSIS — E119 Type 2 diabetes mellitus without complications: Secondary | ICD-10-CM | POA: Diagnosis not present

## 2013-01-10 DIAGNOSIS — I619 Nontraumatic intracerebral hemorrhage, unspecified: Secondary | ICD-10-CM | POA: Diagnosis not present

## 2013-01-10 DIAGNOSIS — M1710 Unilateral primary osteoarthritis, unspecified knee: Secondary | ICD-10-CM | POA: Diagnosis not present

## 2013-01-11 DIAGNOSIS — M1710 Unilateral primary osteoarthritis, unspecified knee: Secondary | ICD-10-CM | POA: Diagnosis not present

## 2013-01-11 DIAGNOSIS — I619 Nontraumatic intracerebral hemorrhage, unspecified: Secondary | ICD-10-CM | POA: Diagnosis not present

## 2013-01-14 DIAGNOSIS — I619 Nontraumatic intracerebral hemorrhage, unspecified: Secondary | ICD-10-CM | POA: Diagnosis not present

## 2013-01-14 DIAGNOSIS — M1710 Unilateral primary osteoarthritis, unspecified knee: Secondary | ICD-10-CM | POA: Diagnosis not present

## 2013-01-16 DIAGNOSIS — I619 Nontraumatic intracerebral hemorrhage, unspecified: Secondary | ICD-10-CM | POA: Diagnosis not present

## 2013-01-16 DIAGNOSIS — M1710 Unilateral primary osteoarthritis, unspecified knee: Secondary | ICD-10-CM | POA: Diagnosis not present

## 2013-01-17 DIAGNOSIS — I6350 Cerebral infarction due to unspecified occlusion or stenosis of unspecified cerebral artery: Secondary | ICD-10-CM | POA: Diagnosis not present

## 2013-01-17 DIAGNOSIS — E119 Type 2 diabetes mellitus without complications: Secondary | ICD-10-CM | POA: Diagnosis not present

## 2013-01-17 DIAGNOSIS — E789 Disorder of lipoprotein metabolism, unspecified: Secondary | ICD-10-CM | POA: Diagnosis not present

## 2013-01-17 DIAGNOSIS — I10 Essential (primary) hypertension: Secondary | ICD-10-CM | POA: Diagnosis not present

## 2013-01-21 DIAGNOSIS — I619 Nontraumatic intracerebral hemorrhage, unspecified: Secondary | ICD-10-CM | POA: Diagnosis not present

## 2013-01-21 DIAGNOSIS — M1710 Unilateral primary osteoarthritis, unspecified knee: Secondary | ICD-10-CM | POA: Diagnosis not present

## 2013-01-23 DIAGNOSIS — I619 Nontraumatic intracerebral hemorrhage, unspecified: Secondary | ICD-10-CM | POA: Diagnosis not present

## 2013-01-23 DIAGNOSIS — M1710 Unilateral primary osteoarthritis, unspecified knee: Secondary | ICD-10-CM | POA: Diagnosis not present

## 2013-01-29 DIAGNOSIS — M1710 Unilateral primary osteoarthritis, unspecified knee: Secondary | ICD-10-CM | POA: Diagnosis not present

## 2013-01-29 DIAGNOSIS — I619 Nontraumatic intracerebral hemorrhage, unspecified: Secondary | ICD-10-CM | POA: Diagnosis not present

## 2013-01-30 DIAGNOSIS — M1710 Unilateral primary osteoarthritis, unspecified knee: Secondary | ICD-10-CM | POA: Diagnosis not present

## 2013-01-30 DIAGNOSIS — I619 Nontraumatic intracerebral hemorrhage, unspecified: Secondary | ICD-10-CM | POA: Diagnosis not present

## 2013-02-04 DIAGNOSIS — I619 Nontraumatic intracerebral hemorrhage, unspecified: Secondary | ICD-10-CM | POA: Diagnosis not present

## 2013-02-04 DIAGNOSIS — M1710 Unilateral primary osteoarthritis, unspecified knee: Secondary | ICD-10-CM | POA: Diagnosis not present

## 2013-02-05 ENCOUNTER — Encounter: Payer: Self-pay | Admitting: *Deleted

## 2013-02-05 ENCOUNTER — Encounter: Payer: Medicare Other | Attending: Endocrinology | Admitting: *Deleted

## 2013-02-05 DIAGNOSIS — I10 Essential (primary) hypertension: Secondary | ICD-10-CM | POA: Insufficient documentation

## 2013-02-05 DIAGNOSIS — Z713 Dietary counseling and surveillance: Secondary | ICD-10-CM | POA: Insufficient documentation

## 2013-02-05 DIAGNOSIS — E119 Type 2 diabetes mellitus without complications: Secondary | ICD-10-CM | POA: Diagnosis not present

## 2013-02-05 NOTE — Patient Instructions (Addendum)
Plan:  Aim for 3 Carb Choices per meal (45 grams) +/- 1 either way  Aim for 0-2 Carbs per snack if hungry  Consider reading food labels for Total Carbohydrate of foods Consider  increasing your activity for 15 minutes daily as tolerated Continue checking BG at alternate times per day as directed by MD  Consider taking medication Lantus at consistent time at night (10 PM) as directed by MD

## 2013-02-05 NOTE — Progress Notes (Signed)
  Medical Nutrition Therapy:  Appt start time: 1030 end time:  1130.  Assessment:  Primary concerns today: patient here for diabetes education and Carb Counting follow up visit. She is here with her Sandi Mealy who brought her to the appt. She states she continues to check BG twice a day, BG log sheet shows improvement to 100-150 mg/dl. She has resumed the treadmill for 15 minutes with incline! She is also going to physical therapy twice a week. No reported low BGs lately.  MEDICATIONS: see list, diabetes medication includes    DIETARY INTAKE:  Usual eating pattern includes 2 meals and 2-3 snacks per day.  Everyday foods include good variety of all food groups.  Avoided foods include fried foods, milk due to lactose intolerance.    24-hr recall:  B (12 PM): fresh fruit OR regular oatmeal OR [employer] on way home after PT Snk ( AM): none  L (2 PM): cereal with  OR bacon and tomato sandwich with fresh fruit with water Snk ( PM): yes- graham crackers OR corn or potato chips out of the container D ( PM): meat, starch and lots of vegetables, water Snk ( PM): has cut back on the  2-3 pieces of individual chocolate candy and some grapes Beverages: water, occasionally sweet tea, regular coke for low BG only  Usual physical activity: used to walk on treadmill every other day  Estimated energy needs: 1400 calories 158 g carbohydrates 105 g protein 39 g fat  Progress Towards Goal(s):  In progress.   Nutritional Diagnosis:  NB-1.1 Food and nutrition-related knowledge deficit As related to diabetes management.  As evidenced by obesity and BMI of 40.8    Intervention:  Nutrition counseling and diabetes education continued. Spent today's visit primarily on Carb Counting and sample meals, recommending a goal of 3 Carb Choices per meal and up to 2 Carb Choices for any snacks if hungry. She expressed good understanding of Carb Counting by verbalizing sample meals using food models.  Plan:  Aim for  3 Carb Choices per meal (45 grams) +/- 1 either way  Aim for 0-2 Carbs per snack if hungry  Consider reading food labels for Total Carbohydrate of foods Consider  increasing your activity for 15 minutes daily as tolerated Continue checking BG at alternate times per day as directed by MD  Consider taking medication Lantus at consistent time at night (10 PM) as directed by MD  Handouts given during visit include: Carb Counting and Food Label handouts Meal Plan Card   Monitoring/Evaluation:  Dietary intake, exercise, consistent carb meals, and body weight PRN. Patient chooses to call if needed in the future.

## 2013-02-06 DIAGNOSIS — I619 Nontraumatic intracerebral hemorrhage, unspecified: Secondary | ICD-10-CM | POA: Diagnosis not present

## 2013-02-06 DIAGNOSIS — M1710 Unilateral primary osteoarthritis, unspecified knee: Secondary | ICD-10-CM | POA: Diagnosis not present

## 2013-02-06 NOTE — Progress Notes (Signed)
Note: increase in A1c from 6.8% in April 2014 to 9.8% in September 2014.

## 2013-02-12 DIAGNOSIS — M1710 Unilateral primary osteoarthritis, unspecified knee: Secondary | ICD-10-CM | POA: Diagnosis not present

## 2013-02-12 DIAGNOSIS — I619 Nontraumatic intracerebral hemorrhage, unspecified: Secondary | ICD-10-CM | POA: Diagnosis not present

## 2013-02-13 DIAGNOSIS — M1710 Unilateral primary osteoarthritis, unspecified knee: Secondary | ICD-10-CM | POA: Diagnosis not present

## 2013-02-13 DIAGNOSIS — I619 Nontraumatic intracerebral hemorrhage, unspecified: Secondary | ICD-10-CM | POA: Diagnosis not present

## 2013-02-18 DIAGNOSIS — M1710 Unilateral primary osteoarthritis, unspecified knee: Secondary | ICD-10-CM | POA: Diagnosis not present

## 2013-02-18 DIAGNOSIS — I619 Nontraumatic intracerebral hemorrhage, unspecified: Secondary | ICD-10-CM | POA: Diagnosis not present

## 2013-02-20 DIAGNOSIS — I619 Nontraumatic intracerebral hemorrhage, unspecified: Secondary | ICD-10-CM | POA: Diagnosis not present

## 2013-02-20 DIAGNOSIS — M1710 Unilateral primary osteoarthritis, unspecified knee: Secondary | ICD-10-CM | POA: Diagnosis not present

## 2013-02-27 DIAGNOSIS — I619 Nontraumatic intracerebral hemorrhage, unspecified: Secondary | ICD-10-CM | POA: Diagnosis not present

## 2013-02-27 DIAGNOSIS — M1710 Unilateral primary osteoarthritis, unspecified knee: Secondary | ICD-10-CM | POA: Diagnosis not present

## 2013-02-28 DIAGNOSIS — I619 Nontraumatic intracerebral hemorrhage, unspecified: Secondary | ICD-10-CM | POA: Diagnosis not present

## 2013-02-28 DIAGNOSIS — M1710 Unilateral primary osteoarthritis, unspecified knee: Secondary | ICD-10-CM | POA: Diagnosis not present

## 2013-02-28 DIAGNOSIS — E119 Type 2 diabetes mellitus without complications: Secondary | ICD-10-CM | POA: Diagnosis not present

## 2013-03-04 DIAGNOSIS — I619 Nontraumatic intracerebral hemorrhage, unspecified: Secondary | ICD-10-CM | POA: Diagnosis not present

## 2013-03-04 DIAGNOSIS — M1710 Unilateral primary osteoarthritis, unspecified knee: Secondary | ICD-10-CM | POA: Diagnosis not present

## 2013-03-06 DIAGNOSIS — M1710 Unilateral primary osteoarthritis, unspecified knee: Secondary | ICD-10-CM | POA: Diagnosis not present

## 2013-03-06 DIAGNOSIS — I619 Nontraumatic intracerebral hemorrhage, unspecified: Secondary | ICD-10-CM | POA: Diagnosis not present

## 2013-03-19 ENCOUNTER — Encounter: Payer: Self-pay | Admitting: Internal Medicine

## 2013-03-19 ENCOUNTER — Encounter: Payer: Self-pay | Admitting: *Deleted

## 2013-03-20 ENCOUNTER — Encounter: Payer: Self-pay | Admitting: Internal Medicine

## 2013-03-20 ENCOUNTER — Ambulatory Visit (INDEPENDENT_AMBULATORY_CARE_PROVIDER_SITE_OTHER): Payer: Medicare Other | Admitting: Internal Medicine

## 2013-03-20 VITALS — BP 160/76 | HR 71 | Ht 66.0 in | Wt 266.7 lb

## 2013-03-20 DIAGNOSIS — Z794 Long term (current) use of insulin: Secondary | ICD-10-CM

## 2013-03-20 DIAGNOSIS — I6350 Cerebral infarction due to unspecified occlusion or stenosis of unspecified cerebral artery: Secondary | ICD-10-CM

## 2013-03-20 DIAGNOSIS — I10 Essential (primary) hypertension: Secondary | ICD-10-CM | POA: Diagnosis not present

## 2013-03-20 DIAGNOSIS — I639 Cerebral infarction, unspecified: Secondary | ICD-10-CM | POA: Insufficient documentation

## 2013-03-20 DIAGNOSIS — E1021 Type 1 diabetes mellitus with diabetic nephropathy: Secondary | ICD-10-CM | POA: Insufficient documentation

## 2013-03-20 DIAGNOSIS — E2601 Conn's syndrome: Secondary | ICD-10-CM | POA: Diagnosis not present

## 2013-03-20 DIAGNOSIS — E119 Type 2 diabetes mellitus without complications: Secondary | ICD-10-CM

## 2013-03-20 DIAGNOSIS — I129 Hypertensive chronic kidney disease with stage 1 through stage 4 chronic kidney disease, or unspecified chronic kidney disease: Secondary | ICD-10-CM | POA: Insufficient documentation

## 2013-03-20 LAB — BASIC METABOLIC PANEL
CO2: 28 mEq/L (ref 19–32)
Chloride: 104 mEq/L (ref 96–112)
Creat: 1.83 mg/dL — ABNORMAL HIGH (ref 0.50–1.10)
Potassium: 4.2 mEq/L (ref 3.5–5.3)
Sodium: 141 mEq/L (ref 135–145)

## 2013-03-20 MED ORDER — SPIRONOLACTONE 25 MG PO TABS: 25.0000 mg | ORAL_TABLET | Freq: Two times a day (BID) | ORAL | Status: DC

## 2013-03-20 NOTE — Patient Instructions (Signed)
Follow up in 6 months 

## 2013-03-20 NOTE — Progress Notes (Signed)
OFFICE NOTE  Chief Complaint:  Routine follow-up  Primary Care Physician: Dwan Bolt, MD  HPI:  Robin Foley is a 57 year-old female who I initially saw for an abnormal EKG. She has a significant history of malignant hypertension, which has been difficult to control, currently on four or five different medications including: Exforge/HCTZ 10/320/25, labetalol 200 mg b.i.d., and furosemide 40 mg daily. She had a significant stroke in 2006 with some resultant right hemiplegia and difficulty with speech, which she has recovered to some extent. She also has a history of borderline sleep apnea in 2003 and since then has had a 70 pound weight gain. When questioned about this, she reports she does snore and gasp and wakes herself up gasping at night. In addition, she continues to have resistant hypertension with blood pressures of [id number] to [id number] systolic despite her medical regimen. Recently she had an EKG in your office, which showed some nonspecific T wave changes and was referred for evaluation of this. In the office today, her EKG does show T wave inversions in II, III, and aVF and flattening, possibly suggestive of ischemia. Symptomatically, she does report shortness of breath with minimal exertion but denies any chest pain or pressure per se. I performed additional testing and demonstrated an elevated plasma renin to aldosterone ratio. Was diagnosed with Conn syndrome and treated with Aldactone. This is actually cause a marked improvement in her blood pressure and at her last visit it was 112/70. A partially today she reports some double noncompliance with other medications. She was diagnosed with sleep apnea however she was minimally compliant in 2013 going 62 days without usage of the device.  She recently underwent laboratory work to Dr. Eugenio Hoes office showing an elevated creatinine of 2 which is chronic and related to uncontrolled hypertension. Her most recent A1c in September 2014 was  9.6.  PMHx:  Past Medical History  Diagnosis Date  . Stroke 2006    right hemiplegia   . Malignant hypertension   . Hyperlipidemia   . Diabetes mellitus     insulin-dependent   . Chronic kidney disease   . Sleep apnea 08/2011    AHI during total sleep 102.40/hr and during REM 0.00/hr (severe sleep apnea  . Conn syndrome     elevated plasma-renin-aldosterone ratio  . History of nuclear stress test 07/22/2011    lexiscan; normal pattern of perfusion, low risk     Past Surgical History  Procedure Laterality Date  . Breast surgery  1975  . Abdominal hysterectomy  1993  . Uterine fibroid surgery  1987  . Transthoracic echocardiogram  07/22/2011    EF=>55%, mild conc LVH; borderline RV enlargement; LA mild-mod dilated; trace MR & TR, RVSP elevated    FAMHx:  Family History  Problem Relation Age of Onset  . Diabetes Mother   . ALS Mother   . Breast cancer Sister   . Cancer Maternal Grandmother     SOCHx:   reports that she quit smoking about 8 years ago. She has never used smokeless tobacco. Her alcohol and drug histories are not on file.  ALLERGIES:  Allergies  Allergen Reactions  . Penicillins Hives    ROS: A comprehensive review of systems was negative except for: Respiratory: positive for dyspnea on exertion  HOME MEDS: Current Outpatient Prescriptions  Medication Sig Dispense Refill  . amLODipine (NORVASC) 10 MG tablet Take 10 mg by mouth daily.      Marland Kitchen aspirin 81 MG tablet Take 81  mg by mouth daily.      Marland Kitchen atorvastatin (LIPITOR) 20 MG tablet Take 20 mg by mouth daily.      . furosemide (LASIX) 40 MG tablet Take 20 mg by mouth daily.      . insulin glargine (LANTUS) 100 UNIT/ML injection Inject 30 Units into the skin at bedtime.      Marland Kitchen labetalol (NORMODYNE) 200 MG tablet Take 400 mg by mouth 2 (two) times daily. Take 2 tablets (400mg ) bid      . Liraglutide (VICTOZA) 18 MG/3ML SOPN Inject 1.8 mg into the skin.      Marland Kitchen spironolactone (ALDACTONE) 25 MG tablet Take 1  tablet (25 mg total) by mouth 2 (two) times daily.  60 tablet  11   No current facility-administered medications for this visit.    LABS/IMAGING: No results found for this or any previous visit (from the past 48 hour(s)). No results found.  VITALS: BP 160/76  Pulse 71  Ht 5\' 6"  (1.676 m)  Wt 266 lb 11.2 oz (120.974 kg)  BMI 43.07 kg/m2  EXAM: General appearance: alert and no distress Neck: no carotid bruit and no JVD Lungs: clear to auscultation bilaterally Heart: regular rate and rhythm, S1, S2 normal, no murmur, click, rub or gallop Abdomen: soft, non-tender; bowel sounds normal; no masses,  no organomegaly and obese Extremities: extremities normal, atraumatic, no cyanosis or edema Pulses: 2+ and symmetric Skin: Skin color, texture, turgor normal. No rashes or lesions Neurologic: Right sided weakness, some slurred speech Psych: Mood, affect normal  EKG: Normal sinus rhythm at 71  ASSESSMENT: 1. Malignant hypertension-uncontrolled 2. History of Conn syndrome-noncompliant with Aldactone 3. Obstructive sleep apnea-noncompliant 4. 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 History of stroke in 2006 6. Morbid obesity 7. 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  PLAN: 1.   Robin Foley is unfortunately noncompliant with her medications. This led to her stroke, uncontrolled hypertension, poorly controlled diabetes and chronic kidney disease.  I've again stressed the importance of compliance. We've refilled her medicines for Aldactone to take twice daily and recommended laboratory work to look at her CMP and plasma renin-aldosterone ratio.  Followup in 6 months.  Pixie Casino, MD, [facility] Attending Cardiologist [facility]  Kallon Caylor C 03/20/2013, 11:34 AM

## 2013-03-21 DIAGNOSIS — I6789 Other cerebrovascular disease: Secondary | ICD-10-CM | POA: Diagnosis not present

## 2013-03-21 DIAGNOSIS — E119 Type 2 diabetes mellitus without complications: Secondary | ICD-10-CM | POA: Diagnosis not present

## 2013-03-21 DIAGNOSIS — Z23 Encounter for immunization: Secondary | ICD-10-CM | POA: Diagnosis not present

## 2013-03-21 DIAGNOSIS — E789 Disorder of lipoprotein metabolism, unspecified: Secondary | ICD-10-CM | POA: Diagnosis not present

## 2013-03-21 DIAGNOSIS — N189 Chronic kidney disease, unspecified: Secondary | ICD-10-CM | POA: Diagnosis not present

## 2013-03-25 LAB — ALDOSTERONE + RENIN ACTIVITY W/ RATIO
ALDO / PRA Ratio: 218.2 Ratio — ABNORMAL HIGH (ref 0.9–28.9)
Aldosterone: 24 ng/dL
PRA LC/MS/MS: 0.11 ng/mL/h — ABNORMAL LOW (ref 0.25–5.82)

## 2013-03-29 ENCOUNTER — Telehealth: Payer: Self-pay | Admitting: *Deleted

## 2013-03-29 NOTE — Telephone Encounter (Signed)
Called to inform of lab results - renin-aldosterone ratio too high, need to increase spironolactone to BID. Patient agreed with plan & verbalized understanding.

## 2013-03-29 NOTE — Telephone Encounter (Signed)
Message copied by Fidel Levy on Fri Mar 29, 2013  5:10 PM ------      Message from: Pixie Casino      Created: Thu Mar 28, 2013  9:08 AM       Please notify her that her renin-aldosterone ratio is high.  She needs to restart taking spironolactone twice daily as we have instructed in the past. ------

## 2013-06-24 DIAGNOSIS — E119 Type 2 diabetes mellitus without complications: Secondary | ICD-10-CM | POA: Diagnosis not present

## 2013-07-03 DIAGNOSIS — N189 Chronic kidney disease, unspecified: Secondary | ICD-10-CM | POA: Diagnosis not present

## 2013-07-03 DIAGNOSIS — E119 Type 2 diabetes mellitus without complications: Secondary | ICD-10-CM | POA: Diagnosis not present

## 2013-07-03 DIAGNOSIS — I10 Essential (primary) hypertension: Secondary | ICD-10-CM | POA: Diagnosis not present

## 2013-07-03 DIAGNOSIS — E789 Disorder of lipoprotein metabolism, unspecified: Secondary | ICD-10-CM | POA: Diagnosis not present

## 2013-07-16 ENCOUNTER — Other Ambulatory Visit: Payer: Self-pay

## 2013-07-16 DIAGNOSIS — Z1231 Encounter for screening mammogram for malignant neoplasm of breast: Secondary | ICD-10-CM

## 2013-07-26 ENCOUNTER — Ambulatory Visit
Admission: RE | Admit: 2013-07-26 | Discharge: 2013-07-26 | Disposition: A | Discharge status: Home / Self Care | Payer: Medicare Other | Source: Ambulatory Visit

## 2013-07-26 DIAGNOSIS — Z1231 Encounter for screening mammogram for malignant neoplasm of breast: Secondary | ICD-10-CM

## 2013-09-18 DIAGNOSIS — I10 Essential (primary) hypertension: Secondary | ICD-10-CM | POA: Diagnosis not present

## 2013-09-18 DIAGNOSIS — Z79899 Other long term (current) drug therapy: Secondary | ICD-10-CM | POA: Diagnosis not present

## 2013-09-18 DIAGNOSIS — E119 Type 2 diabetes mellitus without complications: Secondary | ICD-10-CM | POA: Diagnosis not present

## 2013-09-18 DIAGNOSIS — E789 Disorder of lipoprotein metabolism, unspecified: Secondary | ICD-10-CM | POA: Diagnosis not present

## 2013-09-24 DIAGNOSIS — I10 Essential (primary) hypertension: Secondary | ICD-10-CM | POA: Diagnosis not present

## 2013-09-24 DIAGNOSIS — E789 Disorder of lipoprotein metabolism, unspecified: Secondary | ICD-10-CM | POA: Diagnosis not present

## 2013-09-24 DIAGNOSIS — M79609 Pain in unspecified limb: Secondary | ICD-10-CM | POA: Diagnosis not present

## 2013-09-24 DIAGNOSIS — E119 Type 2 diabetes mellitus without complications: Secondary | ICD-10-CM | POA: Diagnosis not present

## 2013-10-07 DIAGNOSIS — H43399 Other vitreous opacities, unspecified eye: Secondary | ICD-10-CM | POA: Diagnosis not present

## 2013-10-07 DIAGNOSIS — H524 Presbyopia: Secondary | ICD-10-CM | POA: Diagnosis not present

## 2013-10-07 DIAGNOSIS — E119 Type 2 diabetes mellitus without complications: Secondary | ICD-10-CM | POA: Diagnosis not present

## 2013-10-07 DIAGNOSIS — H2510 Age-related nuclear cataract, unspecified eye: Secondary | ICD-10-CM | POA: Diagnosis not present

## 2013-10-10 DIAGNOSIS — G609 Hereditary and idiopathic neuropathy, unspecified: Secondary | ICD-10-CM | POA: Diagnosis not present

## 2013-10-10 DIAGNOSIS — E119 Type 2 diabetes mellitus without complications: Secondary | ICD-10-CM | POA: Diagnosis not present

## 2013-10-10 DIAGNOSIS — E789 Disorder of lipoprotein metabolism, unspecified: Secondary | ICD-10-CM | POA: Diagnosis not present

## 2013-10-10 DIAGNOSIS — N189 Chronic kidney disease, unspecified: Secondary | ICD-10-CM | POA: Diagnosis not present

## 2013-12-05 DIAGNOSIS — E119 Type 2 diabetes mellitus without complications: Secondary | ICD-10-CM | POA: Diagnosis not present

## 2013-12-10 ENCOUNTER — Ambulatory Visit: Payer: Medicare Other | Admitting: Physical Medicine and Rehabilitation

## 2013-12-10 ENCOUNTER — Ambulatory Visit: Payer: Medicare Other | Admitting: Physical Medicine & Rehabilitation

## 2013-12-11 DIAGNOSIS — I6789 Other cerebrovascular disease: Secondary | ICD-10-CM | POA: Diagnosis not present

## 2013-12-11 DIAGNOSIS — E119 Type 2 diabetes mellitus without complications: Secondary | ICD-10-CM | POA: Diagnosis not present

## 2013-12-11 DIAGNOSIS — N390 Urinary tract infection, site not specified: Secondary | ICD-10-CM | POA: Diagnosis not present

## 2013-12-13 DIAGNOSIS — N183 Chronic kidney disease, stage 3 unspecified: Secondary | ICD-10-CM | POA: Diagnosis not present

## 2013-12-13 DIAGNOSIS — E7800 Pure hypercholesterolemia, unspecified: Secondary | ICD-10-CM | POA: Diagnosis not present

## 2013-12-13 DIAGNOSIS — E1129 Type 2 diabetes mellitus with other diabetic kidney complication: Secondary | ICD-10-CM | POA: Diagnosis not present

## 2013-12-13 DIAGNOSIS — I129 Hypertensive chronic kidney disease with stage 1 through stage 4 chronic kidney disease, or unspecified chronic kidney disease: Secondary | ICD-10-CM | POA: Diagnosis not present

## 2014-01-03 ENCOUNTER — Encounter: Payer: Medicare Other | Attending: Physical Medicine & Rehabilitation | Admitting: Physical Medicine & Rehabilitation

## 2014-01-03 ENCOUNTER — Encounter: Payer: Self-pay | Admitting: Physical Medicine & Rehabilitation

## 2014-01-03 VITALS — BP 137/88 | HR 79 | Resp 14 | Ht 66.0 in | Wt 273.0 lb

## 2014-01-03 DIAGNOSIS — I619 Nontraumatic intracerebral hemorrhage, unspecified: Secondary | ICD-10-CM | POA: Insufficient documentation

## 2014-01-03 DIAGNOSIS — M1710 Unilateral primary osteoarthritis, unspecified knee: Secondary | ICD-10-CM | POA: Diagnosis not present

## 2014-01-03 DIAGNOSIS — I10 Essential (primary) hypertension: Secondary | ICD-10-CM | POA: Insufficient documentation

## 2014-01-03 DIAGNOSIS — M1711 Unilateral primary osteoarthritis, right knee: Secondary | ICD-10-CM

## 2014-01-03 DIAGNOSIS — I613 Nontraumatic intracerebral hemorrhage in brain stem: Secondary | ICD-10-CM

## 2014-01-03 NOTE — Patient Instructions (Signed)
PLEASE CALL ME WITH ANY PROBLEMS OR QUESTIONS (#[phone]).      

## 2014-01-03 NOTE — Progress Notes (Signed)
Subjective:    Patient ID: Robin Foley, female    DOB: 07/02/1955, 58 y.o.   MRN: [id number]  HPI  Mrs. Jerge is back regarding her gait disorder related to her midbrain ICH. She is trying to exercise more. She walks on her tread mill typically 15 minutes daily. She has not progressed past that point however. She is dependent upon her walker for balance. She hasn't fallen in some time.  She is no longer having a lot of pain at this point. She does have occasional pain in the right knee.    Pain Inventory Average Pain 1 Pain Right Now 1 My pain is dull  In the last 24 hours, has pain interfered with the following? General activity 0 Relation with others 0 Enjoyment of life 0 What TIME of day is your pain at its worst? varies Sleep (in general) Good  Pain is worse with: walking and standing Pain improves with: rest Relief from Meds: no pain meds  Mobility walk with assistance use a walker ability to climb steps?  yes do you drive?  no transfers alone  Function disabled: date disabled na  Neuro/Psych No problems in this area  Prior Studies Any changes since last visit?  no  Physicians involved in your care Any changes since last visit?  no   Family History  Problem Relation Age of Onset  . Diabetes Mother   . ALS Mother   . Breast cancer Sister   . Cancer Maternal Grandmother    History   Social History  . Marital Status: Married    Spouse Name: N/A    Number of Children: 2  . Years of Education: N/A   Social History Main Topics  . Smoking status: Former Smoker    Quit date: 05/11/2004  . Smokeless tobacco: Never Used  . Alcohol Use: None  . Drug Use: None  . Sexual Activity: None   Other Topics Concern  . None   Social History Narrative  . None   Past Surgical History  Procedure Laterality Date  . Breast surgery  1975  . Abdominal hysterectomy  1993  . Uterine fibroid surgery  1987  . Transthoracic echocardiogram  07/22/2011   EF=>55%, mild conc LVH; borderline RV enlargement; LA mild-mod dilated; trace MR & TR, RVSP elevated   Past Medical History  Diagnosis Date  . Stroke 2006    right hemiplegia   . Malignant hypertension   . Hyperlipidemia   . Diabetes mellitus     insulin-dependent   . Chronic kidney disease   . Sleep apnea 08/2011    AHI during total sleep 102.40/hr and during REM 0.00/hr (severe sleep apnea  . Conn syndrome     elevated plasma-renin-aldosterone ratio  . History of nuclear stress test 07/22/2011    lexiscan; normal pattern of perfusion, low risk    BP 137/88  Pulse 79  Resp 14  Ht 5\' 6"  (1.676 m)  Wt 273 lb (123.832 kg)  BMI 44.08 kg/m2  SpO2 96%  Opioid Risk Score:   Fall Risk Score: High Fall Risk (>13 points) (pt educated given brochure)    Review of Systems  All other systems reviewed and are negative.      Objective:   Physical Exam   Constitutional: She is oriented to person, place, and time. She appears well-developed and well-nourished.  HENT:  Head: Normocephalic and atraumatic.  Right Ear: External ear normal.  Left Ear: External ear normal.  Mouth/Throat: Oropharynx is  clear and moist.  Eyes: Conjunctivae and EOM are normal. Pupils are equal, round, and reactive to light.  Neck: Normal range of motion.  Cardiovascular: Normal rate and regular rhythm.  Pulmonary/Chest: Effort normal and breath sounds normal.  Abdominal: Soft. Bowel sounds are normal.  Neurological: She is alert and oriented to person, place, and time.  Psychiatric: She has a normal mood and affect. Her behavior is normal. Judgment and thought content normal.  patient remains morbidly obese.  She ambulated with her rolling walker. She needed extra time to change directions. Sometimes drifts off to the left when walking. Had minor difficulties with threshold into the exam room.  There is significant valgus deformity at the left knee.   Strength is grossly 4/5 in the upper extremities  proximal to distal. Lower extremity strength is 3+ proximal to 4/5 distally. She has diminished fine motor coordination still in the left greater than right limbs but both are functional in use.Marland Kitchen Speech is improved.    Cognitively she has fair insight and awareness. Memory is generally appropriate.      Assessment & Plan:   1. Right Mid Brain Hemorrhage  2. Morbid Obesity  3 OA of the right knee  4. Hx of costochondritis of the right 4th and 5th ribs.  Plan:  1. Completed long term disability paperwork for the patient today. She is permanently disabled.  2. Reviewed the benefits of trying to increase her strength and ambulation in a controlled,safe approach 3. I will see the patient back PRN.  All questions were encouraged and answered.

## 2014-03-17 DIAGNOSIS — E118 Type 2 diabetes mellitus with unspecified complications: Secondary | ICD-10-CM | POA: Diagnosis not present

## 2014-03-21 DIAGNOSIS — M1712 Unilateral primary osteoarthritis, left knee: Secondary | ICD-10-CM | POA: Diagnosis not present

## 2014-03-21 DIAGNOSIS — M25562 Pain in left knee: Secondary | ICD-10-CM | POA: Diagnosis not present

## 2014-03-21 DIAGNOSIS — I10 Essential (primary) hypertension: Secondary | ICD-10-CM | POA: Diagnosis not present

## 2014-03-21 DIAGNOSIS — M79605 Pain in left leg: Secondary | ICD-10-CM | POA: Diagnosis not present

## 2014-03-21 DIAGNOSIS — Z23 Encounter for immunization: Secondary | ICD-10-CM | POA: Diagnosis not present

## 2014-03-21 DIAGNOSIS — M79642 Pain in left hand: Secondary | ICD-10-CM | POA: Diagnosis not present

## 2014-03-21 DIAGNOSIS — M79641 Pain in right hand: Secondary | ICD-10-CM | POA: Diagnosis not present

## 2014-03-21 DIAGNOSIS — M79643 Pain in unspecified hand: Secondary | ICD-10-CM | POA: Diagnosis not present

## 2014-05-26 DIAGNOSIS — M25569 Pain in unspecified knee: Secondary | ICD-10-CM | POA: Diagnosis not present

## 2014-05-26 DIAGNOSIS — M150 Primary generalized (osteo)arthritis: Secondary | ICD-10-CM | POA: Diagnosis not present

## 2014-09-22 DIAGNOSIS — I10 Essential (primary) hypertension: Secondary | ICD-10-CM | POA: Diagnosis not present

## 2014-09-22 DIAGNOSIS — E118 Type 2 diabetes mellitus with unspecified complications: Secondary | ICD-10-CM | POA: Diagnosis not present

## 2014-09-22 DIAGNOSIS — M25569 Pain in unspecified knee: Secondary | ICD-10-CM | POA: Diagnosis not present

## 2014-09-22 DIAGNOSIS — E789 Disorder of lipoprotein metabolism, unspecified: Secondary | ICD-10-CM | POA: Diagnosis not present

## 2014-09-29 DIAGNOSIS — E118 Type 2 diabetes mellitus with unspecified complications: Secondary | ICD-10-CM | POA: Diagnosis not present

## 2014-09-29 DIAGNOSIS — I10 Essential (primary) hypertension: Secondary | ICD-10-CM | POA: Diagnosis not present

## 2014-09-29 DIAGNOSIS — J449 Chronic obstructive pulmonary disease, unspecified: Secondary | ICD-10-CM | POA: Diagnosis not present

## 2014-09-29 DIAGNOSIS — E789 Disorder of lipoprotein metabolism, unspecified: Secondary | ICD-10-CM | POA: Diagnosis not present

## 2014-10-09 DIAGNOSIS — H43393 Other vitreous opacities, bilateral: Secondary | ICD-10-CM | POA: Diagnosis not present

## 2014-10-09 DIAGNOSIS — H2513 Age-related nuclear cataract, bilateral: Secondary | ICD-10-CM | POA: Diagnosis not present

## 2014-10-09 DIAGNOSIS — E119 Type 2 diabetes mellitus without complications: Secondary | ICD-10-CM | POA: Diagnosis not present

## 2014-10-09 DIAGNOSIS — H04123 Dry eye syndrome of bilateral lacrimal glands: Secondary | ICD-10-CM | POA: Diagnosis not present

## 2014-10-15 DIAGNOSIS — M25569 Pain in unspecified knee: Secondary | ICD-10-CM | POA: Diagnosis not present

## 2014-10-15 DIAGNOSIS — M150 Primary generalized (osteo)arthritis: Secondary | ICD-10-CM | POA: Diagnosis not present

## 2015-01-22 DIAGNOSIS — E118 Type 2 diabetes mellitus with unspecified complications: Secondary | ICD-10-CM | POA: Diagnosis not present

## 2015-01-29 DIAGNOSIS — N390 Urinary tract infection, site not specified: Secondary | ICD-10-CM | POA: Diagnosis not present

## 2015-01-29 DIAGNOSIS — Z23 Encounter for immunization: Secondary | ICD-10-CM | POA: Diagnosis not present

## 2015-01-29 DIAGNOSIS — E118 Type 2 diabetes mellitus with unspecified complications: Secondary | ICD-10-CM | POA: Diagnosis not present

## 2015-01-29 DIAGNOSIS — N3281 Overactive bladder: Secondary | ICD-10-CM | POA: Diagnosis not present

## 2015-02-10 ENCOUNTER — Encounter: Payer: Self-pay | Admitting: Neurology

## 2015-02-10 ENCOUNTER — Ambulatory Visit (INDEPENDENT_AMBULATORY_CARE_PROVIDER_SITE_OTHER): Payer: Medicare Other | Admitting: Neurology

## 2015-02-10 VITALS — BP 133/71 | HR 69 | Ht 66.0 in | Wt 279.2 lb

## 2015-02-10 DIAGNOSIS — G4733 Obstructive sleep apnea (adult) (pediatric): Secondary | ICD-10-CM | POA: Diagnosis not present

## 2015-02-10 DIAGNOSIS — E785 Hyperlipidemia, unspecified: Secondary | ICD-10-CM

## 2015-02-10 DIAGNOSIS — R413 Other amnesia: Secondary | ICD-10-CM | POA: Insufficient documentation

## 2015-02-10 DIAGNOSIS — E2601 Conn's syndrome: Secondary | ICD-10-CM | POA: Diagnosis not present

## 2015-02-10 DIAGNOSIS — Z8679 Personal history of other diseases of the circulatory system: Secondary | ICD-10-CM | POA: Insufficient documentation

## 2015-02-10 DIAGNOSIS — E1021 Type 1 diabetes mellitus with diabetic nephropathy: Secondary | ICD-10-CM

## 2015-02-10 DIAGNOSIS — N184 Chronic kidney disease, stage 4 (severe): Secondary | ICD-10-CM | POA: Insufficient documentation

## 2015-02-10 DIAGNOSIS — G3184 Mild cognitive impairment, so stated: Secondary | ICD-10-CM

## 2015-02-10 DIAGNOSIS — I152 Hypertension secondary to endocrine disorders: Secondary | ICD-10-CM | POA: Diagnosis not present

## 2015-02-10 NOTE — Patient Instructions (Signed)
-   your memory is not too bad, we think your memory may related to your vascular atherosclosis in the brain - will have CT head to evaluate - will have neck ultrasound to evulate - will ask you PCP to check your blood tests - Follow up with your primary care physician for stroke risk factor modification. Recommend maintain blood pressure goal <130/80, diabetes with hemoglobin A1c goal below 6.5% and lipids with LDL cholesterol goal below 70 mg/dL.  - continue ASA and lipitor for stroke prevention - follow up in 3 months.

## 2015-02-10 NOTE — Progress Notes (Signed)
NEUROLOGY CLINIC NEW PATIENT NOTE  NAME: Robin Foley DOB: [birth year] REFERRING PHYSICIAN: Anda Kraft, MD  I saw Robin Foley as a new consult in the neurovascular clinic today regarding  Chief Complaint  Patient presents with  . Referral    new patient for memory loss  .  HPI: Robin Foley is a 59 y.o. 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   Patient stated that for the last 6 months she felt her memory was getting worse. She cannot remember when her memory started to have a problem but for Last six months she noticed the difference. She stated that she cannot remember at night what she was arguing with her children during the day; she cannot remember whether she had taken medications; she cannot remember what happened the day before. However she stated that her long-term memory is good, she was able to do most of her ADLs although she is not cooking too much and her husband usually do the finance part. She denies any accident of setting stoves to fire, making flat due to forgetting to turn off water faucet, or lost in familiar places. She denies any depression, or thyroid disease.  She had a history of diabetes, glucose has been up and down. She also has a history of malignant hypertension which was initially difficult to control with 4-5 different medications. Later, she was found to have elevated plasma renin to aldosterone ratio and was subsequently diagnosed with conn syndrome and treated with spironolactone which caused a marked improvement in blood pressure control. 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 She had followed with Dr. Naaman Plummer until 2014. She also has history of hyperlipidemia and OSA.  He quit smoking in 2006 after stroke, denies alcohol or illicit drugs.  Mom died of ALS and dad  died when she was young.  Past Medical History  Diagnosis Date  . Stroke [facility]) 2006    right hemiplegia   . Malignant hypertension   . Hyperlipidemia   . Diabetes mellitus     insulin-dependent   . Chronic kidney disease   . Sleep apnea 08/2011    AHI during total sleep 102.40/hr and during REM 0.00/hr (severe sleep apnea  . Conn syndrome ([place])     elevated plasma-renin-aldosterone ratio  . History of nuclear stress test 07/22/2011    lexiscan; normal pattern of perfusion, low risk   . Memory loss    Past Surgical History  Procedure Laterality Date  . Breast surgery  1975  . Abdominal hysterectomy  1993  . Uterine fibroid surgery  1987  . Transthoracic echocardiogram  07/22/2011    EF=>55%, mild conc LVH; borderline RV enlargement; LA mild-mod dilated; trace MR & TR, RVSP elevated   Family History  Problem Relation Age of Onset  . Diabetes Mother   . ALS Mother   . Breast cancer Sister   . Cancer Maternal Grandmother    Current Outpatient Prescriptions  Medication Sig Dispense Refill  . amLODipine (NORVASC) 10 MG tablet Take 10 mg by mouth daily.    Marland Kitchen aspirin 81 MG tablet Take 81 mg by mouth daily.    Marland Kitchen atorvastatin (LIPITOR) 20 MG tablet Take 20 mg by mouth daily.    Marland Kitchen BAYER CONTOUR TEST test strip     . BAYER  MICROLET LANCETS lancets     . insulin glargine (LANTUS) 100 UNIT/ML injection Inject 30 Units into the skin 2 (two) times daily.     Marland Kitchen labetalol (NORMODYNE) 200 MG tablet Take 400 mg by mouth 2 (two) times daily. Take 2 tablets (400mg ) bid    . LANTUS SOLOSTAR 100 UNIT/ML Solostar Pen     . Liraglutide (VICTOZA) 18 MG/3ML SOPN Inject 1.8 mg into the skin.    Marland Kitchen NOVOFINE 32G X 6 MM MISC     . ramipril (ALTACE) 5 MG capsule     . spironolactone (ALDACTONE) 25 MG tablet Take 1 tablet (25 mg total) by mouth 2 (two) times daily. 60 tablet 11   No current facility-administered medications for this visit.   Allergies  Allergen Reactions  . Penicillins Hives    Social History   Social History  . Marital Status: Married    Spouse Name: N/A  . Number of Children: 2  . Years of Education: N/A   Occupational History  . Not on file.   Social History Main Topics  . Smoking status: Former Smoker    Quit date: 05/11/2004  . Smokeless tobacco: Never Used  . Alcohol Use: Not on file  . Drug Use: Not on file  . Sexual Activity: Not on file   Other Topics Concern  . Not on file   Social History Narrative    Review of Systems Full 14 system review of systems performed and notable only for those listed, all others are neg:  Constitutional:   Cardiovascular:  Ear/Nose/Throat:   Skin:  Eyes:   Respiratory:   Gastroitestinal:   Genitourinary: Urination problems Hematology/Lymphatic:   Endocrine:  Musculoskeletal:   Allergy/Immunology:   Neurological:  Memory loss Psychiatric:  Sleep: Snoring   Physical Exam  Filed Vitals:   02/10/15 0939  BP: 133/71  Pulse: 69    General - morbid obesity, well developed, in no apparent distress.  Ophthalmologic - fundi not visualized due to eye movement.  Cardiovascular - Regular rate and rhythm with no murmur.   Neck - supple, no nuchal rigidity .  Mental Status -  Level of arousal and orientation to time, place, and person were intact. Language including expression, naming, repetition, comprehension was assessed and found intact, but mild to moderate dysarthria. Fund of Knowledge was assessed and was impaired.  mini-mental status exam  Orientation to time - 4/5 Orientation to place - 4/5 Registration - 3/3 Attention - 5/5 Delayed recall - 3/3 Naming - 2/2 Repetition - 1/1 Comprehension - 3/3 Reading - 1/1 Writing - 1/1 Visuospatial - 1/1  Total 28/30  MOCA  visuospatial - executive 4/5 Naming - 3/3 Memory -  Attention - 2/2, 1/1, 3/3 Language - 2/2, 0/1 Abstraction - 1/2 Delayed recall - 3/5 Orientation - 5/6  Total - 24/30  Cranial Nerves II - XII - II -  Visual field intact OU. III, IV, VI - Extraocular movements exam showed right CN VI partial impairment. V - Facial sensation intact bilaterally. VII - Facial movement intact bilaterally. VIII - Hearing & vestibular intact bilaterally. X - Palate elevates symmetrically, mild to moderate dysarthria. XI - Chin turning & shoulder shrug intact bilaterally. XII - Tongue protrusion intact.  Motor Strength - The patient's strength was 4+/5 in all extremities and pronator drift was absent.  Bulk was normal and fasciculations were absent.   Motor Tone - Muscle tone was assessed at the neck and appendages and was normal.  Reflexes -  The patient's reflexes were 1+ in all extremities and she had no pathological reflexes.  Sensory - Light touch, temperature/pinprick were assessed and were normal.    Coordination - The patient had normal movements in the hands and feet with no ataxia or dysmetria.  Tremor was absent.  Gait and Station - walk with walker, slow, small stride, mildly spastic gait.   Imaging  I have personally reviewed the radiological images below and agree with the radiology interpretations.  CT head 05/18/2004 - 1 cm hemorrhage in the upper mid brain on the right.  CT head 05/19/2004 - slight increase in the size of a hemorrhage in the right midbrain  Lab Review Component     Latest Ref Rng 03/20/2013  PRA LC/MS/MS     0.25 - 5.82 Foley/mL/h 0.11 (L)  ALDO / PRA Ratio     0.9 - 28.9 Ratio 218.2 (H)  ALDOSTERONE      24    Assessment and Plan:   In summary, Robin Foley is a 59 y.o. 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 MMSE 28 and MOCA 24. She has multiple risk factor for stroke and had a history of small midbrain ICH. 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 Will continue aspirin and Lipitor for stroke prevention, as well as aggressive stroke  risk factor modification. Will also follow up in clinic to perform serial MOCA and MMSE tests. Patient refused MRI due to claustrophobia, and not candidate for CTA head and neck due to CKD. Will check CT head without contrast, carotid Doppler. Refer to PCP to check dementia panels.  - head CT without contrast - Carotid Doppler - Patient has PCP follow-up next month, recommend to check dementia panel is including B-12, TSH, free T4, A1c, RPR. - Follow up with your primary care physician for stroke risk factor modification. Recommend maintain blood pressure goal <130/80, diabetes with hemoglobin A1c goal below 6.5% and lipids with LDL cholesterol goal below 70 mg/dL.  - Continue aspirin and Lipitor for stroke prevention - RTC in 3 months  I recommend aggressive blood pressure control with a goal <130/80 mm Hg.  Lipids should be managed intensively, with a goal LDL < 70 mg/dL.  I encouraged the patient to discuss these important issues with her primary care physician.  I counseled the patient on measures to reduce stroke risk, including the importance of medication compliance, risk factor control, exercise, healthy diet, and avoidance of smoking.  I reviewed stroke warning signs and symptoms and appropriate actions to take if such occurs.   Thank you very much for the opportunity to participate in the care of this patient.  Please do not hesitate to call if any questions or concerns arise.  A total of 60 minutes was spent face-to-face with this patient including neuropsych testing with MMSE and MOCA. Over half this time was spent on counseling patient on the MCI diagnosis and different diagnostic and therapeutic options available.    Orders Placed This Encounter  Procedures  . US Carotid Bilateral    Standing Status: Future     Number of Occurrences:      Standing Expiration Date: 04/13/2016    Order Specific Question:  Reason for Exam (SYMPTOM  OR DIAGNOSIS REQUIRED)    Answer:  history of stroke     Order Specific Question:  Preferred imaging location?  Answer:  Internal  . CT Head Wo Contrast    Standing Status: Future     Number of Occurrences:      Standing Expiration Date: 05/12/2016    Order Specific Question:  Reason for Exam (SYMPTOM  OR DIAGNOSIS REQUIRED)    Answer:  history of stroke    Order Specific Question:  Is the patient pregnant?    Answer:  No    Order Specific Question:  Preferred imaging location?    Answer:  Internal    Meds ordered this encounter  Medications  . BAYER CONTOUR TEST test strip    Sig:   . LANTUS SOLOSTAR 100 UNIT/ML Solostar Pen    Sig:   . NOVOFINE 32G X 6 MM MISC    Sig:   . BAYER MICROLET LANCETS lancets    Sig:   . ramipril (ALTACE) 5 MG capsule    Sig:     Patient Instructions  - your memory is not too bad, we think your memory may related to your vascular atherosclosis in the brain - will have CT head to evaluate - will have neck ultrasound to evulate - will ask you PCP to check your blood tests - Follow up with your primary care physician for stroke risk factor modification. Recommend maintain blood pressure goal <130/80, diabetes with hemoglobin A1c goal below 6.5% and lipids with LDL cholesterol goal below 70 mg/dL.  - continue ASA and lipitor for stroke prevention - follow up in 3 months.    Rosalin Hawking, MD PhD [facility] [address], [place], [place] [address] [phone]

## 2015-02-13 ENCOUNTER — Encounter: Payer: Self-pay | Admitting: Neurology

## 2015-02-19 ENCOUNTER — Ambulatory Visit (INDEPENDENT_AMBULATORY_CARE_PROVIDER_SITE_OTHER): Payer: Medicare Other

## 2015-02-19 DIAGNOSIS — R413 Other amnesia: Secondary | ICD-10-CM | POA: Diagnosis not present

## 2015-02-25 ENCOUNTER — Telehealth: Payer: Self-pay

## 2015-02-25 NOTE — Telephone Encounter (Signed)
Rn notified patient that her carotid doppler was unremarkable, with no carotid stenosis bilaterally. Rn stated she is to continue treatment plan. Patient verbalized understanding and was happy of the results.

## 2015-02-26 DIAGNOSIS — I129 Hypertensive chronic kidney disease with stage 1 through stage 4 chronic kidney disease, or unspecified chronic kidney disease: Secondary | ICD-10-CM | POA: Diagnosis not present

## 2015-02-26 DIAGNOSIS — E7800 Pure hypercholesterolemia, unspecified: Secondary | ICD-10-CM | POA: Diagnosis not present

## 2015-02-26 DIAGNOSIS — N183 Chronic kidney disease, stage 3 (moderate): Secondary | ICD-10-CM | POA: Diagnosis not present

## 2015-02-26 DIAGNOSIS — E1122 Type 2 diabetes mellitus with diabetic chronic kidney disease: Secondary | ICD-10-CM | POA: Diagnosis not present

## 2015-02-26 DIAGNOSIS — I639 Cerebral infarction, unspecified: Secondary | ICD-10-CM | POA: Diagnosis not present

## 2015-04-10 DIAGNOSIS — E1122 Type 2 diabetes mellitus with diabetic chronic kidney disease: Secondary | ICD-10-CM | POA: Diagnosis not present

## 2015-05-20 ENCOUNTER — Ambulatory Visit: Payer: Medicare Other | Admitting: Neurology

## 2015-06-17 DIAGNOSIS — E789 Disorder of lipoprotein metabolism, unspecified: Secondary | ICD-10-CM | POA: Diagnosis not present

## 2015-06-17 DIAGNOSIS — E118 Type 2 diabetes mellitus with unspecified complications: Secondary | ICD-10-CM | POA: Diagnosis not present

## 2015-06-17 DIAGNOSIS — R05 Cough: Secondary | ICD-10-CM | POA: Diagnosis not present

## 2015-07-09 DIAGNOSIS — E118 Type 2 diabetes mellitus with unspecified complications: Secondary | ICD-10-CM | POA: Diagnosis not present

## 2015-07-09 DIAGNOSIS — I10 Essential (primary) hypertension: Secondary | ICD-10-CM | POA: Diagnosis not present

## 2015-07-27 DIAGNOSIS — E789 Disorder of lipoprotein metabolism, unspecified: Secondary | ICD-10-CM | POA: Diagnosis not present

## 2015-07-27 DIAGNOSIS — T887XXA Unspecified adverse effect of drug or medicament, initial encounter: Secondary | ICD-10-CM | POA: Diagnosis not present

## 2015-07-27 DIAGNOSIS — E118 Type 2 diabetes mellitus with unspecified complications: Secondary | ICD-10-CM | POA: Diagnosis not present

## 2015-09-25 DIAGNOSIS — E118 Type 2 diabetes mellitus with unspecified complications: Secondary | ICD-10-CM | POA: Diagnosis not present

## 2015-09-25 DIAGNOSIS — E559 Vitamin D deficiency, unspecified: Secondary | ICD-10-CM | POA: Diagnosis not present

## 2015-09-25 DIAGNOSIS — I10 Essential (primary) hypertension: Secondary | ICD-10-CM | POA: Diagnosis not present

## 2015-09-30 DIAGNOSIS — E789 Disorder of lipoprotein metabolism, unspecified: Secondary | ICD-10-CM | POA: Diagnosis not present

## 2015-09-30 DIAGNOSIS — I10 Essential (primary) hypertension: Secondary | ICD-10-CM | POA: Diagnosis not present

## 2015-09-30 DIAGNOSIS — E118 Type 2 diabetes mellitus with unspecified complications: Secondary | ICD-10-CM | POA: Diagnosis not present

## 2015-10-13 DIAGNOSIS — H25013 Cortical age-related cataract, bilateral: Secondary | ICD-10-CM | POA: Diagnosis not present

## 2015-10-13 DIAGNOSIS — H2513 Age-related nuclear cataract, bilateral: Secondary | ICD-10-CM | POA: Diagnosis not present

## 2015-10-13 DIAGNOSIS — H04123 Dry eye syndrome of bilateral lacrimal glands: Secondary | ICD-10-CM | POA: Diagnosis not present

## 2015-10-13 DIAGNOSIS — E119 Type 2 diabetes mellitus without complications: Secondary | ICD-10-CM | POA: Diagnosis not present

## 2015-11-24 DIAGNOSIS — E118 Type 2 diabetes mellitus with unspecified complications: Secondary | ICD-10-CM | POA: Diagnosis not present

## 2015-11-24 DIAGNOSIS — I10 Essential (primary) hypertension: Secondary | ICD-10-CM | POA: Diagnosis not present

## 2015-12-01 DIAGNOSIS — N289 Disorder of kidney and ureter, unspecified: Secondary | ICD-10-CM | POA: Diagnosis not present

## 2015-12-01 DIAGNOSIS — I10 Essential (primary) hypertension: Secondary | ICD-10-CM | POA: Diagnosis not present

## 2015-12-01 DIAGNOSIS — E118 Type 2 diabetes mellitus with unspecified complications: Secondary | ICD-10-CM | POA: Diagnosis not present

## 2015-12-22 DIAGNOSIS — Z1211 Encounter for screening for malignant neoplasm of colon: Secondary | ICD-10-CM | POA: Diagnosis not present

## 2016-01-19 DIAGNOSIS — Z23 Encounter for immunization: Secondary | ICD-10-CM | POA: Diagnosis not present

## 2016-01-19 DIAGNOSIS — I10 Essential (primary) hypertension: Secondary | ICD-10-CM | POA: Diagnosis not present

## 2016-01-19 DIAGNOSIS — E118 Type 2 diabetes mellitus with unspecified complications: Secondary | ICD-10-CM | POA: Diagnosis not present

## 2016-01-19 DIAGNOSIS — I639 Cerebral infarction, unspecified: Secondary | ICD-10-CM | POA: Diagnosis not present

## 2016-02-04 DIAGNOSIS — R350 Frequency of micturition: Secondary | ICD-10-CM | POA: Diagnosis not present

## 2016-02-04 DIAGNOSIS — R351 Nocturia: Secondary | ICD-10-CM | POA: Diagnosis not present

## 2016-02-04 DIAGNOSIS — N3944 Nocturnal enuresis: Secondary | ICD-10-CM | POA: Diagnosis not present

## 2016-02-04 DIAGNOSIS — N3941 Urge incontinence: Secondary | ICD-10-CM | POA: Diagnosis not present

## 2016-02-16 DIAGNOSIS — E1122 Type 2 diabetes mellitus with diabetic chronic kidney disease: Secondary | ICD-10-CM | POA: Diagnosis not present

## 2016-02-16 DIAGNOSIS — E7800 Pure hypercholesterolemia, unspecified: Secondary | ICD-10-CM | POA: Diagnosis not present

## 2016-02-16 DIAGNOSIS — N183 Chronic kidney disease, stage 3 (moderate): Secondary | ICD-10-CM | POA: Diagnosis not present

## 2016-02-16 DIAGNOSIS — Z6841 Body Mass Index (BMI) 40.0 and over, adult: Secondary | ICD-10-CM | POA: Diagnosis not present

## 2016-02-16 DIAGNOSIS — I129 Hypertensive chronic kidney disease with stage 1 through stage 4 chronic kidney disease, or unspecified chronic kidney disease: Secondary | ICD-10-CM | POA: Diagnosis not present

## 2016-02-16 DIAGNOSIS — I639 Cerebral infarction, unspecified: Secondary | ICD-10-CM | POA: Diagnosis not present

## 2016-02-24 DIAGNOSIS — E559 Vitamin D deficiency, unspecified: Secondary | ICD-10-CM | POA: Diagnosis not present

## 2016-02-24 DIAGNOSIS — E118 Type 2 diabetes mellitus with unspecified complications: Secondary | ICD-10-CM | POA: Diagnosis not present

## 2016-02-24 DIAGNOSIS — I10 Essential (primary) hypertension: Secondary | ICD-10-CM | POA: Diagnosis not present

## 2016-02-24 DIAGNOSIS — E789 Disorder of lipoprotein metabolism, unspecified: Secondary | ICD-10-CM | POA: Diagnosis not present

## 2016-03-02 DIAGNOSIS — I10 Essential (primary) hypertension: Secondary | ICD-10-CM | POA: Diagnosis not present

## 2016-03-02 DIAGNOSIS — E118 Type 2 diabetes mellitus with unspecified complications: Secondary | ICD-10-CM | POA: Diagnosis not present

## 2016-03-02 DIAGNOSIS — I639 Cerebral infarction, unspecified: Secondary | ICD-10-CM | POA: Diagnosis not present

## 2016-03-02 DIAGNOSIS — E789 Disorder of lipoprotein metabolism, unspecified: Secondary | ICD-10-CM | POA: Diagnosis not present

## 2016-03-17 DIAGNOSIS — R350 Frequency of micturition: Secondary | ICD-10-CM | POA: Diagnosis not present

## 2016-03-17 DIAGNOSIS — N3944 Nocturnal enuresis: Secondary | ICD-10-CM | POA: Diagnosis not present

## 2016-03-17 DIAGNOSIS — N3941 Urge incontinence: Secondary | ICD-10-CM | POA: Diagnosis not present

## 2016-03-18 DIAGNOSIS — I129 Hypertensive chronic kidney disease with stage 1 through stage 4 chronic kidney disease, or unspecified chronic kidney disease: Secondary | ICD-10-CM | POA: Diagnosis not present

## 2016-04-28 DIAGNOSIS — I10 Essential (primary) hypertension: Secondary | ICD-10-CM | POA: Diagnosis not present

## 2016-04-28 DIAGNOSIS — E118 Type 2 diabetes mellitus with unspecified complications: Secondary | ICD-10-CM | POA: Diagnosis not present

## 2016-05-05 DIAGNOSIS — I10 Essential (primary) hypertension: Secondary | ICD-10-CM | POA: Diagnosis not present

## 2016-05-05 DIAGNOSIS — E118 Type 2 diabetes mellitus with unspecified complications: Secondary | ICD-10-CM | POA: Diagnosis not present

## 2016-05-05 DIAGNOSIS — I639 Cerebral infarction, unspecified: Secondary | ICD-10-CM | POA: Diagnosis not present

## 2016-05-05 DIAGNOSIS — N3281 Overactive bladder: Secondary | ICD-10-CM | POA: Diagnosis not present

## 2016-06-22 DIAGNOSIS — E118 Type 2 diabetes mellitus with unspecified complications: Secondary | ICD-10-CM | POA: Diagnosis not present

## 2016-06-28 DIAGNOSIS — E789 Disorder of lipoprotein metabolism, unspecified: Secondary | ICD-10-CM | POA: Diagnosis not present

## 2016-06-28 DIAGNOSIS — N3281 Overactive bladder: Secondary | ICD-10-CM | POA: Diagnosis not present

## 2016-06-28 DIAGNOSIS — I10 Essential (primary) hypertension: Secondary | ICD-10-CM | POA: Diagnosis not present

## 2016-06-28 DIAGNOSIS — E118 Type 2 diabetes mellitus with unspecified complications: Secondary | ICD-10-CM | POA: Diagnosis not present

## 2016-07-13 DIAGNOSIS — M25569 Pain in unspecified knee: Secondary | ICD-10-CM | POA: Diagnosis not present

## 2016-07-13 DIAGNOSIS — M25559 Pain in unspecified hip: Secondary | ICD-10-CM | POA: Diagnosis not present

## 2016-07-13 DIAGNOSIS — M170 Bilateral primary osteoarthritis of knee: Secondary | ICD-10-CM | POA: Diagnosis not present

## 2016-09-21 DIAGNOSIS — I10 Essential (primary) hypertension: Secondary | ICD-10-CM | POA: Diagnosis not present

## 2016-09-21 DIAGNOSIS — E118 Type 2 diabetes mellitus with unspecified complications: Secondary | ICD-10-CM | POA: Diagnosis not present

## 2016-10-05 DIAGNOSIS — E118 Type 2 diabetes mellitus with unspecified complications: Secondary | ICD-10-CM | POA: Diagnosis not present

## 2016-10-05 DIAGNOSIS — I10 Essential (primary) hypertension: Secondary | ICD-10-CM | POA: Diagnosis not present

## 2016-10-27 DIAGNOSIS — N390 Urinary tract infection, site not specified: Secondary | ICD-10-CM | POA: Diagnosis not present

## 2016-10-27 DIAGNOSIS — M25569 Pain in unspecified knee: Secondary | ICD-10-CM | POA: Diagnosis not present

## 2016-10-27 DIAGNOSIS — E118 Type 2 diabetes mellitus with unspecified complications: Secondary | ICD-10-CM | POA: Diagnosis not present

## 2016-10-27 DIAGNOSIS — I10 Essential (primary) hypertension: Secondary | ICD-10-CM | POA: Diagnosis not present

## 2016-10-27 DIAGNOSIS — E789 Disorder of lipoprotein metabolism, unspecified: Secondary | ICD-10-CM | POA: Diagnosis not present

## 2016-11-02 DIAGNOSIS — N390 Urinary tract infection, site not specified: Secondary | ICD-10-CM | POA: Diagnosis not present

## 2016-11-02 DIAGNOSIS — E118 Type 2 diabetes mellitus with unspecified complications: Secondary | ICD-10-CM | POA: Diagnosis not present

## 2016-11-02 DIAGNOSIS — I10 Essential (primary) hypertension: Secondary | ICD-10-CM | POA: Diagnosis not present

## 2016-11-02 DIAGNOSIS — I639 Cerebral infarction, unspecified: Secondary | ICD-10-CM | POA: Diagnosis not present

## 2016-12-05 DIAGNOSIS — I10 Essential (primary) hypertension: Secondary | ICD-10-CM | POA: Diagnosis not present

## 2016-12-05 DIAGNOSIS — N390 Urinary tract infection, site not specified: Secondary | ICD-10-CM | POA: Diagnosis not present

## 2016-12-12 DIAGNOSIS — Z8673 Personal history of transient ischemic attack (TIA), and cerebral infarction without residual deficits: Secondary | ICD-10-CM | POA: Diagnosis not present

## 2016-12-12 DIAGNOSIS — I10 Essential (primary) hypertension: Secondary | ICD-10-CM | POA: Diagnosis not present

## 2016-12-12 DIAGNOSIS — G4733 Obstructive sleep apnea (adult) (pediatric): Secondary | ICD-10-CM | POA: Diagnosis not present

## 2016-12-12 DIAGNOSIS — E2601 Conn's syndrome: Secondary | ICD-10-CM | POA: Diagnosis not present

## 2016-12-22 DIAGNOSIS — E782 Mixed hyperlipidemia: Secondary | ICD-10-CM | POA: Diagnosis not present

## 2016-12-22 DIAGNOSIS — I10 Essential (primary) hypertension: Secondary | ICD-10-CM | POA: Diagnosis not present

## 2016-12-22 DIAGNOSIS — E1122 Type 2 diabetes mellitus with diabetic chronic kidney disease: Secondary | ICD-10-CM | POA: Diagnosis not present

## 2016-12-22 DIAGNOSIS — Z780 Asymptomatic menopausal state: Secondary | ICD-10-CM | POA: Diagnosis not present

## 2017-01-17 DIAGNOSIS — E1122 Type 2 diabetes mellitus with diabetic chronic kidney disease: Secondary | ICD-10-CM | POA: Diagnosis not present

## 2017-01-19 DIAGNOSIS — I10 Essential (primary) hypertension: Secondary | ICD-10-CM | POA: Diagnosis not present

## 2017-01-19 DIAGNOSIS — E1122 Type 2 diabetes mellitus with diabetic chronic kidney disease: Secondary | ICD-10-CM | POA: Diagnosis not present

## 2017-01-20 DIAGNOSIS — I639 Cerebral infarction, unspecified: Secondary | ICD-10-CM | POA: Diagnosis not present

## 2017-01-20 DIAGNOSIS — E782 Mixed hyperlipidemia: Secondary | ICD-10-CM | POA: Diagnosis not present

## 2017-01-20 DIAGNOSIS — E1122 Type 2 diabetes mellitus with diabetic chronic kidney disease: Secondary | ICD-10-CM | POA: Diagnosis not present

## 2017-01-20 DIAGNOSIS — I10 Essential (primary) hypertension: Secondary | ICD-10-CM | POA: Diagnosis not present

## 2017-02-07 DIAGNOSIS — E7800 Pure hypercholesterolemia, unspecified: Secondary | ICD-10-CM | POA: Diagnosis not present

## 2017-02-07 DIAGNOSIS — I129 Hypertensive chronic kidney disease with stage 1 through stage 4 chronic kidney disease, or unspecified chronic kidney disease: Secondary | ICD-10-CM | POA: Diagnosis not present

## 2017-02-07 DIAGNOSIS — I639 Cerebral infarction, unspecified: Secondary | ICD-10-CM | POA: Diagnosis not present

## 2017-02-07 DIAGNOSIS — Z6841 Body Mass Index (BMI) 40.0 and over, adult: Secondary | ICD-10-CM | POA: Diagnosis not present

## 2017-02-07 DIAGNOSIS — E1122 Type 2 diabetes mellitus with diabetic chronic kidney disease: Secondary | ICD-10-CM | POA: Diagnosis not present

## 2017-02-07 DIAGNOSIS — N183 Chronic kidney disease, stage 3 (moderate): Secondary | ICD-10-CM | POA: Diagnosis not present

## 2017-02-16 DIAGNOSIS — H25013 Cortical age-related cataract, bilateral: Secondary | ICD-10-CM | POA: Diagnosis not present

## 2017-02-16 DIAGNOSIS — H35033 Hypertensive retinopathy, bilateral: Secondary | ICD-10-CM | POA: Diagnosis not present

## 2017-02-16 DIAGNOSIS — H2513 Age-related nuclear cataract, bilateral: Secondary | ICD-10-CM | POA: Diagnosis not present

## 2017-02-16 DIAGNOSIS — E119 Type 2 diabetes mellitus without complications: Secondary | ICD-10-CM | POA: Diagnosis not present

## 2017-03-14 ENCOUNTER — Encounter: Payer: Self-pay | Admitting: Internal Medicine

## 2017-03-14 ENCOUNTER — Ambulatory Visit (INDEPENDENT_AMBULATORY_CARE_PROVIDER_SITE_OTHER): Payer: Medicare Other | Admitting: Internal Medicine

## 2017-03-14 VITALS — BP 122/78 | HR 74 | Ht 66.0 in | Wt 272.0 lb

## 2017-03-14 DIAGNOSIS — I612 Nontraumatic intracerebral hemorrhage in hemisphere, unspecified: Secondary | ICD-10-CM | POA: Diagnosis not present

## 2017-03-14 DIAGNOSIS — G4733 Obstructive sleep apnea (adult) (pediatric): Secondary | ICD-10-CM | POA: Diagnosis not present

## 2017-03-14 NOTE — Assessment & Plan Note (Signed)
She is not physically very active and is unlikely to lose weight on her own.

## 2017-03-14 NOTE — Progress Notes (Signed)
03/14/17-61 year old female former smoker for sleep evaluation. Sleep Consult-Dr Shelia Media. Pt had sleep study about 7 years ago and was not able to tolerate CPAP.  Medical history significant for Conn Syndrome(hyperaldosteronism), CKD IV, DM 1, HBP, CVA/ R midbrain hemorrhage NPSG 08/09/11-[facility]- AHI 102/ hr, desat to 84%, CPAP inadequate and titrated to BIPAP 20/16, with residual apneas and desaturation, body weight 277 pounds. Husband says she snores loudly and still has witnessed apneas. She admits daytime drowsiness if she sits quietly, especially after lunch. Blames her medications for making her sleepy. Does take occasional deliberate nap. No sleep medicines and no caffeine or stimulants. She has kicked the sheet off a couple of times but no routine or complex parasomnias. She denies ENT or heart disease history. Stroke in 2006. No seizures.  Prior to Admission medications   Medication Sig Start Date End Date Taking? Authorizing Provider  acetaminophen (TYLENOL) 650 MG CR tablet Take 650 mg every 8 (eight) hours as needed by mouth for pain.   Yes [provider]  amLODipine (NORVASC) 10 MG tablet Take 10 mg by mouth daily.   Yes [provider]  aspirin 81 MG tablet Take 81 mg by mouth daily.   Yes Anda Kraft, MD  atorvastatin (LIPITOR) 20 MG tablet Take 20 mg by mouth daily.   Yes Anda Kraft, MD  BAYER CONTOUR TEST test strip  01/26/15  Yes [provider]  BAYER MICROLET LANCETS lancets  01/29/15  Yes [provider]  cholecalciferol (VITAMIN D) 1000 units tablet Take 1,000 Units daily by mouth.   Yes [provider]  fesoterodine (TOVIAZ) 8 MG TB24 tablet Take 8 mg daily by mouth.   Yes [provider]  Insulin Glargine (BASAGLAR KWIKPEN) 100 UNIT/ML SOPN Inject into the skin. 30 units BID   Yes [provider]  labetalol (NORMODYNE) 200 MG tablet Take 400 mg by mouth 2 (two) times daily. Take 2 tablets (400mg ) bid   Yes [provider]  Liraglutide (VICTOZA) 18 MG/3ML SOPN Inject 1.8 mg into the skin.   Yes [provider]  NOVOFINE 32G X 6 MM Jewett  02/09/15  Yes [provider]  spironolactone (ALDACTONE) 25 MG tablet Take 1 tablet (25 mg total) by mouth 2 (two) times daily. 03/20/13  Yes Pixie Casino, MD   Past Medical History:  Diagnosis Date  . Chronic kidney disease   . Conn syndrome ([place])    elevated plasma-renin-aldosterone ratio  . Diabetes mellitus    insulin-dependent   . History of nuclear stress test 07/22/2011   lexiscan; normal pattern of perfusion, low risk   . Hyperlipidemia   . Malignant hypertension   . Memory loss   . Sleep apnea 08/2011   AHI during total sleep 102.40/hr and during REM 0.00/hr (severe sleep apnea  . Stroke [facility]) 2006   right hemiplegia    Past Surgical History:  Procedure Laterality Date  . ABDOMINAL HYSTERECTOMY  1993  . BREAST SURGERY  1975  . TRANSTHORACIC ECHOCARDIOGRAM  07/22/2011   EF=>55%, mild conc LVH; borderline RV enlargement; LA mild-mod dilated; trace MR & TR, RVSP elevated  . UTERINE FIBROID SURGERY  1987   Family History  Problem Relation Age of Onset  . Diabetes Mother   . ALS Mother   . Breast cancer Sister   . Cancer Maternal Grandmother    Social History   Socioeconomic History  . Marital status: Married    Spouse name: Not on file  . Number  of children: 2  . Years of education: Not on file  . Highest education level: Not on file  Social Needs  . Financial resource strain: Not on file  . Food insecurity - worry: Not on file  . Food insecurity - inability: Not on file  . Transportation needs - medical: Not on file  . Transportation needs - non-medical: Not on file  Occupational History  . Not on file  Tobacco Use  . Smoking status: Former Smoker    Last attempt to quit: 05/11/2004    Years since quitting: 12.8  . Smokeless tobacco: Never Used  Substance and Sexual Activity  . Alcohol use: Not on file  .  Drug use: Not on file  . Sexual activity: Not on file  Other Topics Concern  . Not on file  Social History Narrative  . Not on file   ROS-see HPI  + = positive Constitutional:    weight loss, night sweats, fevers, chills, + fatigue, lassitude. HEENT:    headaches, +difficulty swallowing, tooth/dental problems, sore throat,       sneezing, itching, ear ache, nasal congestion, post nasal drip, snoring CV:    chest pain, orthopnea, PND, swelling in lower extremities, anasarca,                                  dizziness, palpitations Resp:   shortness of breath with exertion or at rest.                productive cough,   non-productive cough, coughing up of blood.              change in color of mucus.  wheezing.   Skin:    rash or lesions. GI:  No-   heartburn, indigestion, abdominal pain, nausea, vomiting, diarrhea,                 change in bowel habits, loss of appetite GU: dysuria, change in color of urine, no urgency or frequency.   flank pain. MS:   joint pain, stiffness, decreased range of motion, back pain. Neuro-     nothing unusual Psych:  change in mood or affect.  depression or anxiety.   memory loss.  OBJ- Physical Exam General- Alert, Oriented, Affect-appropriate, Distress- none acute, + obese  Skin- rash-none, lesions- none, excoriation- none Lymphadenopathy- none Head- atraumatic            Eyes- Gross vision intact, PERRLA, conjunctivae and secretions clear            Ears- Hearing, canals-normal            Nose- Clear, no-Septal dev, mucus, polyps, erosion, perforation             Throat- Mallampati IV , mucosa clear , drainage- none, tonsils- atrophic, + dentures Neck- flexible , trachea midline, no stridor , thyroid nl, carotid no bruit Chest - symmetrical excursion , unlabored           Heart/CV- RRR , no murmur , no gallop  , no rub, nl s1 s2                           - JVD- none , edema- none, stasis changes- none, varices- none           Lung- clear to P&A,  wheeze- none, cough- none , dullness-none, rub- none  Chest wall-  Abd-  Br/ Gen/ Rectal- Not done, not indicated Extrem- cyanosis- none, clubbing, none, atrophy- none, strength- nl Neuro- + mildly slurred speech, rolling walker

## 2017-03-14 NOTE — Patient Instructions (Addendum)
Order- unattended home sleep test     Dx OSA  Please call me for results and recommendation about 2 weeks after your sleep test. We might be able to start CPAP before I see you back next.  Please call as needed

## 2017-03-14 NOTE — Assessment & Plan Note (Signed)
She denies history of seizure following her stroke in 2006. I don't think there have been additional events.

## 2017-03-14 NOTE — Assessment & Plan Note (Signed)
She felt uncomfortably confined by CPAP mask when she first tried years ago. Symptoms and her husband's report are clear-cut so we sure she still has obstructive apnea. In the context of her previous hemorrhagic stroke, it is important that she get this under control. She has dentures so we'll mouthpiece is not an option. She is willing to look again at CPAP with new her machine and mask choices. Plan-update documentation with new sleep study then I anticipate we will be able to get CPAP started.

## 2017-04-05 DIAGNOSIS — E1122 Type 2 diabetes mellitus with diabetic chronic kidney disease: Secondary | ICD-10-CM | POA: Diagnosis not present

## 2017-04-10 DIAGNOSIS — R609 Edema, unspecified: Secondary | ICD-10-CM | POA: Diagnosis not present

## 2017-04-10 DIAGNOSIS — E1122 Type 2 diabetes mellitus with diabetic chronic kidney disease: Secondary | ICD-10-CM | POA: Diagnosis not present

## 2017-04-11 ENCOUNTER — Other Ambulatory Visit: Payer: Self-pay | Admitting: Internal Medicine

## 2017-04-11 ENCOUNTER — Other Ambulatory Visit (HOSPITAL_COMMUNITY): Payer: Self-pay | Admitting: Internal Medicine

## 2017-04-11 ENCOUNTER — Ambulatory Visit (HOSPITAL_COMMUNITY)
Admission: RE | Admit: 2017-04-11 | Discharge: 2017-04-11 | Disposition: A | Discharge status: Home / Self Care | Payer: Medicare Other | Source: Ambulatory Visit | Attending: Endocrinology | Admitting: Endocrinology

## 2017-04-11 DIAGNOSIS — M79604 Pain in right leg: Secondary | ICD-10-CM

## 2017-04-11 DIAGNOSIS — E1122 Type 2 diabetes mellitus with diabetic chronic kidney disease: Secondary | ICD-10-CM | POA: Diagnosis not present

## 2017-04-11 DIAGNOSIS — N184 Chronic kidney disease, stage 4 (severe): Secondary | ICD-10-CM | POA: Diagnosis not present

## 2017-04-11 DIAGNOSIS — M7989 Other specified soft tissue disorders: Secondary | ICD-10-CM

## 2017-04-11 DIAGNOSIS — R600 Localized edema: Secondary | ICD-10-CM

## 2017-04-11 DIAGNOSIS — D649 Anemia, unspecified: Secondary | ICD-10-CM | POA: Diagnosis not present

## 2017-04-11 DIAGNOSIS — I10 Essential (primary) hypertension: Secondary | ICD-10-CM | POA: Diagnosis not present

## 2017-04-11 DIAGNOSIS — E1121 Type 2 diabetes mellitus with diabetic nephropathy: Secondary | ICD-10-CM | POA: Diagnosis not present

## 2017-04-11 DIAGNOSIS — M150 Primary generalized (osteo)arthritis: Secondary | ICD-10-CM | POA: Diagnosis not present

## 2017-04-11 DIAGNOSIS — E782 Mixed hyperlipidemia: Secondary | ICD-10-CM | POA: Diagnosis not present

## 2017-04-11 NOTE — Progress Notes (Signed)
Right lower extremity venous duplex has been completed. Negative for DVT. Results were given to [facility] at Dr. Denita Lung office.  04/11/17 3:53 PM Robin Foley RVT

## 2017-04-12 ENCOUNTER — Other Ambulatory Visit: Payer: Medicare Other

## 2017-04-12 DIAGNOSIS — G4733 Obstructive sleep apnea (adult) (pediatric): Secondary | ICD-10-CM | POA: Diagnosis not present

## 2017-04-13 DIAGNOSIS — G4733 Obstructive sleep apnea (adult) (pediatric): Secondary | ICD-10-CM | POA: Diagnosis not present

## 2017-04-19 DIAGNOSIS — R600 Localized edema: Secondary | ICD-10-CM | POA: Diagnosis not present

## 2017-04-21 ENCOUNTER — Other Ambulatory Visit: Payer: Self-pay | Admitting: *Deleted

## 2017-04-21 DIAGNOSIS — G4733 Obstructive sleep apnea (adult) (pediatric): Secondary | ICD-10-CM

## 2017-06-22 ENCOUNTER — Encounter: Payer: Self-pay | Admitting: Internal Medicine

## 2017-06-22 ENCOUNTER — Ambulatory Visit (INDEPENDENT_AMBULATORY_CARE_PROVIDER_SITE_OTHER): Payer: Medicare Other | Admitting: Internal Medicine

## 2017-06-22 VITALS — BP 124/78 | HR 82 | Ht 66.0 in | Wt 278.2 lb

## 2017-06-22 DIAGNOSIS — G4733 Obstructive sleep apnea (adult) (pediatric): Secondary | ICD-10-CM

## 2017-06-22 NOTE — Patient Instructions (Signed)
Order- new DME, new CPAP auto 5-20, mask of choice, humidifier, supplies AirView    Dx OSA                               Provide chin strap if needed  Please call us if needed

## 2017-06-22 NOTE — Progress Notes (Signed)
03/14/17-62 year old female former smoker for sleep evaluation. Sleep Consult-Dr Shelia Media. Pt had sleep study about 7 years ago and was not able to tolerate CPAP.  Medical history significant for Conn Syndrome(hyperaldosteronism), CKD IV, DM 1, HBP, CVA/ R midbrain hemorrhage NPSG 08/09/11-[facility]- AHI 102/ hr, desat to 84%, CPAP inadequate and titrated to BIPAP 20/16, with residual apneas and desaturation, body weight 277 pounds. Husband says she snores loudly and still has witnessed apneas. She admits daytime drowsiness if she sits quietly, especially after lunch. Blames her medications for making her sleepy. Does take occasional deliberate nap. No sleep medicines and no caffeine or stimulants. She has kicked the sheet off a couple of times but no routine or complex parasomnias. She denies ENT or heart disease history. Stroke in 2006. No seizures.  06/22/17-62 year old female former smoker followed for OSA complicated by Conn syndrome (hyperaldosteronism), CKD 4, DM 1, HBP, CVA/right mid brain hemorrhage HST-04/12/17-AHI 60.4/hour, desaturation to 54%, body weight 272 pounds -----OSA; Review HST with patient.  We reviewed her sleep study and discussed her remote experience with CPAP.  She had felt claustrophobic with fullface mask and headgear at that time.  She is willing to try again with newer equipment and option to have mask fitting at sleep center if needed.  ROS-see HPI  + = positive Constitutional:    weight loss, night sweats, fevers, chills, + fatigue, lassitude. HEENT:    headaches, +difficulty swallowing, tooth/dental problems, sore throat,       sneezing, itching, ear ache, nasal congestion, post nasal drip, snoring CV:    chest pain, orthopnea, PND, swelling in lower extremities, anasarca,                                  dizziness, palpitations Resp:   shortness of breath with exertion or at rest.                productive cough,   non-productive cough, coughing up of blood.    change in color of mucus.  wheezing.   Skin:    rash or lesions. GI:  No-   heartburn, indigestion, abdominal pain, nausea, vomiting, diarrhea,                 change in bowel habits, loss of appetite GU: dysuria, change in color of urine, no urgency or frequency.   flank pain. MS:   joint pain, stiffness, decreased range of motion, back pain. Neuro-     nothing unusual Psych:  change in mood or affect.  depression or anxiety.   memory loss.  OBJ- Physical Exam General- Alert, Oriented, Affect-appropriate, Distress- none acute, + obese  Skin- rash-none, lesions- none, excoriation- none Lymphadenopathy- none Head- atraumatic            Eyes- Gross vision intact, PERRLA, conjunctivae and secretions clear            Ears- Hearing, canals-normal            Nose- Clear, no-Septal dev, mucus, polyps, erosion, perforation             Throat- Mallampati IV , mucosa clear , drainage- none, tonsils- atrophic, + dentures Neck- flexible , trachea midline, no stridor , thyroid nl, carotid no bruit Chest - symmetrical excursion , unlabored           Heart/CV- RRR , no murmur , no gallop  , no rub, nl s1 s2                           -  JVD- none , edema- none, stasis changes- none, varices- none           Lung- clear to P&A, wheeze- none, cough- none , dullness-none, rub- none           Chest wall-  Abd-  Br/ Gen/ Rectal- Not done, not indicated Extrem- cyanosis- none, clubbing, none, atrophy- none, strength- nl Neuro- + mildly slurred speech, rolling walker

## 2017-06-23 NOTE — Assessment & Plan Note (Signed)
She is strongly encouraged to make a real effort at weight loss.  Do not know if bariatric counseling would be helpful.

## 2017-06-23 NOTE — Assessment & Plan Note (Signed)
She has severe obstructive sleep apnea and she wears dentures.  CPAP was clearly the best available therapy although she may need BiPAP.  I think if we work with her, we should be able to get her comfortable with 1 of the smaller mask styles at [employer].

## 2017-07-06 DIAGNOSIS — E1122 Type 2 diabetes mellitus with diabetic chronic kidney disease: Secondary | ICD-10-CM | POA: Diagnosis not present

## 2017-07-06 DIAGNOSIS — I10 Essential (primary) hypertension: Secondary | ICD-10-CM | POA: Diagnosis not present

## 2017-07-06 DIAGNOSIS — E789 Disorder of lipoprotein metabolism, unspecified: Secondary | ICD-10-CM | POA: Diagnosis not present

## 2017-07-06 DIAGNOSIS — E559 Vitamin D deficiency, unspecified: Secondary | ICD-10-CM | POA: Diagnosis not present

## 2017-07-10 DIAGNOSIS — R945 Abnormal results of liver function studies: Secondary | ICD-10-CM | POA: Diagnosis not present

## 2017-07-10 DIAGNOSIS — R749 Abnormal serum enzyme level, unspecified: Secondary | ICD-10-CM | POA: Diagnosis not present

## 2017-07-13 DIAGNOSIS — I10 Essential (primary) hypertension: Secondary | ICD-10-CM | POA: Diagnosis not present

## 2017-07-13 DIAGNOSIS — E1122 Type 2 diabetes mellitus with diabetic chronic kidney disease: Secondary | ICD-10-CM | POA: Diagnosis not present

## 2017-07-26 DIAGNOSIS — I10 Essential (primary) hypertension: Secondary | ICD-10-CM | POA: Diagnosis not present

## 2017-07-26 DIAGNOSIS — M150 Primary generalized (osteo)arthritis: Secondary | ICD-10-CM | POA: Diagnosis not present

## 2017-07-26 DIAGNOSIS — E782 Mixed hyperlipidemia: Secondary | ICD-10-CM | POA: Diagnosis not present

## 2017-07-26 DIAGNOSIS — N184 Chronic kidney disease, stage 4 (severe): Secondary | ICD-10-CM | POA: Diagnosis not present

## 2017-07-26 DIAGNOSIS — E1122 Type 2 diabetes mellitus with diabetic chronic kidney disease: Secondary | ICD-10-CM | POA: Diagnosis not present

## 2017-07-26 DIAGNOSIS — E1121 Type 2 diabetes mellitus with diabetic nephropathy: Secondary | ICD-10-CM | POA: Diagnosis not present

## 2017-07-26 DIAGNOSIS — D649 Anemia, unspecified: Secondary | ICD-10-CM | POA: Diagnosis not present

## 2017-07-31 ENCOUNTER — Encounter: Payer: Self-pay | Admitting: Internal Medicine

## 2017-09-20 ENCOUNTER — Ambulatory Visit: Payer: 59 | Admitting: Internal Medicine

## 2017-09-21 ENCOUNTER — Ambulatory Visit (INDEPENDENT_AMBULATORY_CARE_PROVIDER_SITE_OTHER): Payer: Medicare Other | Admitting: Internal Medicine

## 2017-09-21 ENCOUNTER — Encounter: Payer: Self-pay | Admitting: Internal Medicine

## 2017-09-21 DIAGNOSIS — G4733 Obstructive sleep apnea (adult) (pediatric): Secondary | ICD-10-CM

## 2017-09-21 NOTE — Patient Instructions (Signed)
Order- DME- please change CPAP to BIPAP auto 22/15, continue mask of choice, humidifier, supplies, AirView. CPAP is not controlling apneas adequately.

## 2017-09-21 NOTE — Progress Notes (Signed)
HPI female former smoker followed for OSA complicated by Conn syndrome (hyperaldosteronism), CKD 4, DM 1, HBP, CVA/right mid brain hemorrhage NPSG 08/09/11-[facility]- AHI 102/ hr, desat to 84%, CPAP inadequate and titrated to BIPAP 20/16, with residual apneas and desaturation, body weight 277 pounds. HST-04/12/17-AHI 60.4/hour, desaturation to 54%, body weight 272 pounds  ----------------------------------------------------------------------- 06/22/17-62 year old female former smoker followed for OSA complicated by Conn syndrome (hyperaldosteronism), CKD 4, DM 1, HBP, CVA/right mid brain hemorrhage HST-04/12/17-AHI 60.4/hour, desaturation to 54%, body weight 272 pounds -----OSA; Review HST with patient.  We reviewed her sleep study and discussed her remote experience with CPAP.  She had felt claustrophobic with fullface mask and headgear at that time.  She is willing to try again with newer equipment and option to have mask fitting at sleep center if needed.  09/21/2017- 62 year old female former smoker followed for OSA complicated by Conn syndrome (hyperaldosteronism), CKD 4, DM 1, HBP, CVA/right mid brain hemorrhage ----OSA: DME [facility] Pt wears CPAP nightly got at least 5 hours. high AHI per DL-attached to OV notes.  Download 100% compliance AHI 16.4/hour.  Auto 5-20 with pressure range 17.1-19.9.  Moderate leak. She notices much less daytime sleepiness since she is using CPAP regularly with fewer naps.  Nasal pillows with chinstrap.  Husband says she still snores quite a bit.  ROS-see HPI  + = positive Constitutional:    weight loss, night sweats, fevers, chills,  fatigue, lassitude. HEENT:    headaches, +difficulty swallowing, tooth/dental problems, sore throat,       sneezing, itching, ear ache, nasal congestion, post nasal drip, snoring CV:    chest pain, orthopnea, PND, swelling in lower extremities, anasarca,                                  dizziness, palpitations Resp:   shortness of breath with  exertion or at rest.                productive cough,   non-productive cough, coughing up of blood.              change in color of mucus.  wheezing.   Skin:    rash or lesions. GI:  No-   heartburn, indigestion, abdominal pain, nausea, vomiting, diarrhea,                 change in bowel habits, loss of appetite GU: dysuria, change in color of urine, no urgency or frequency.   flank pain. MS:   joint pain, stiffness, decreased range of motion, back pain. Neuro-     nothing unusual Psych:  change in mood or affect.  depression or anxiety.   memory loss.  OBJ- Physical Exam General- Alert, Oriented, Affect-appropriate, Distress- none acute, + morbidly obese  Skin- rash-none, lesions- none, excoriation- none Lymphadenopathy- none Head- atraumatic            Eyes- Gross vision intact, PERRLA, conjunctivae and secretions clear            Ears- Hearing, canals-normal            Nose- Clear, no-Septal dev, mucus, polyps, erosion, perforation             Throat- Mallampati IV , mucosa clear , drainage- none, tonsils- atrophic, + dentures,                         +hoarse Neck- flexible ,  trachea midline, no stridor , thyroid nl, carotid no bruit Chest - symmetrical excursion , unlabored           Heart/CV- RRR , no murmur , no gallop  , no rub, nl s1 s2                           - JVD- none , edema- none, stasis changes- none, varices- none           Lung- clear to P&A, wheeze- none, cough- none , dullness-none, rub- none           Chest wall-  Abd-  Br/ Gen/ Rectal- Not done, not indicated Extrem- cyanosis- none, clubbing, none, atrophy- none, strength- nl Neuro- + mildly slurred speech, rolling walker

## 2017-09-22 NOTE — Assessment & Plan Note (Signed)
Multiple medical problems would be improved if she could trend toward normal weight.  I do not know if she could be a surgical candidate for bariatric management but dietary counseling should be considered.

## 2017-09-22 NOTE — Addendum Note (Signed)
Addended by: Clayborne Dana C on: 09/22/2017 11:11 AM   Modules accepted: Orders

## 2017-09-22 NOTE — Assessment & Plan Note (Signed)
She is definitely benefiting from CPAP, comfortable with it as she gets used to using it regularly, and with much less daytime sleepiness.  We need to get the pressure higher for better control and that is going to require change to a BiPAP machine. Plan-change to BiPAP 22/15.  Educated on compliance goals.

## 2017-10-05 DIAGNOSIS — E1122 Type 2 diabetes mellitus with diabetic chronic kidney disease: Secondary | ICD-10-CM | POA: Diagnosis not present

## 2017-10-12 DIAGNOSIS — E1121 Type 2 diabetes mellitus with diabetic nephropathy: Secondary | ICD-10-CM | POA: Diagnosis not present

## 2017-10-12 DIAGNOSIS — I10 Essential (primary) hypertension: Secondary | ICD-10-CM | POA: Diagnosis not present

## 2017-10-17 ENCOUNTER — Encounter (HOSPITAL_BASED_OUTPATIENT_CLINIC_OR_DEPARTMENT_OTHER): Payer: 59

## 2017-10-25 ENCOUNTER — Ambulatory Visit (HOSPITAL_BASED_OUTPATIENT_CLINIC_OR_DEPARTMENT_OTHER): Payer: Medicare Other | Attending: Internal Medicine | Admitting: Internal Medicine

## 2017-10-25 VITALS — Ht 66.0 in | Wt 270.0 lb

## 2017-10-25 DIAGNOSIS — G4733 Obstructive sleep apnea (adult) (pediatric): Secondary | ICD-10-CM | POA: Diagnosis not present

## 2017-11-09 DIAGNOSIS — G4733 Obstructive sleep apnea (adult) (pediatric): Secondary | ICD-10-CM | POA: Diagnosis not present

## 2017-11-09 NOTE — Procedures (Signed)
   Patient Name: Robin Foley, Robin Foley Date: 10/25/2017 Gender: Female D.O.B: [birth year] Age (years): 43 Referring Provider: Baird Lyons MD, ABSM Height (inches): 66 Interpreting Physician: Baird Lyons MD, ABSM Weight (lbs): 270 RPSGT: Zadie Rhine BMI: 44 MRN: [id number] Neck Size: 16.00  CLINICAL INFORMATION The patient is referred for a CPAP titration to treat sleep apnea. Date of NPSG, Split Night or HST:   HST 04/22/17  AHI 60.4/ hr, desaturation to 54%, body weight 272 lbs  SLEEP STUDY TECHNIQUE As per the AASM Manual for the Scoring of Sleep and Associated Events v2.3 (April 2016) with a hypopnea requiring 4% desaturations.  The channels recorded and monitored were frontal, central and occipital EEG, electrooculogram (EOG), submentalis EMG (chin), nasal and oral airflow, thoracic and abdominal wall motion, anterior tibialis EMG, snore microphone, electrocardiogram, and pulse oximetry. Continuous positive airway pressure (CPAP) was initiated at the beginning of the study and titrated to treat sleep-disordered breathing.  MEDICATIONS Medications self-administered by patient taken the night of the study : SPIRONOLACTONE, LABETALOL HCL, ATORVASTATIN, BABY ASPIRIN, VITAMIN D3  TECHNICIAN COMMENTS Comments added by technician: PT [place]. Patient had difficulty initiating sleep. Comments added by scorer: N/A  RESPIRATORY PARAMETERS Optimal PAP Pressure (cm): 17 AHI at Optimal Pressure (/hr): 7.5 Overall Minimal O2 (%): 78.0 Supine % at Optimal Pressure (%): 100 Minimal O2 at Optimal Pressure (%): 90.0   SLEEP ARCHITECTURE The study was initiated at 11:23:33 PM and ended at 5:26:37 AM.  Sleep onset time was 69.7 minutes and the sleep efficiency was 58.6%%. The total sleep time was 212.9 minutes.  The patient spent 5.4%% of the night in stage N1 sleep, 39.9%% in stage N2 sleep, 28.7%% in stage N3 and 26.07% in REM.Stage REM latency was 50.0 minutes  Wake  after sleep onset was 80.5. Alpha intrusion was absent. Supine sleep was 31.19%.  CARDIAC DATA The 2 lead EKG demonstrated sinus rhythm. The mean heart rate was 69.7 beats per minute. Other EKG findings include: None.  LEG MOVEMENT DATA The total Periodic Limb Movements of Sleep (PLMS) were 0. The PLMS index was 0.0. A PLMS index of <15 is considered normal in adults.  IMPRESSIONS - The optimal PAP pressure was 17 cm of water. - Central sleep apnea was not noted during this titration (CAI = 0.0/h). - Oxygen desaturations were observed during this titration (min O2 = 78.0%). Min saturation at CPAP 17 was 90%. - No snoring was audible during this study. - No cardiac abnormalities were observed during this study. - Clinically significant periodic limb movements were not noted during this study. Arousals associated with PLMs were rare.  DIAGNOSIS - Obstructive Sleep Apnea (327.23 [G47.33 ICD-10])  RECOMMENDATIONS - Trial of CPAP therapy on 17 cm H2O or autopap 10-20.  Patient used a Medium size Resmed Full Face Mask AirFit F30 mask and heated humidification. - Sleep hygiene should be reviewed to assess factors that may improve sleep quality. - Weight management and regular exercise should be initiated or continued.  [Electronically signed] 11/09/2017 11:26 AM  Baird Lyons MD, ABSM Diplomate, American Board of Sleep Medicine   NPI: [id number]                          [facility] [place] [facility]  ELECTRONICALLY SIGNED ON:  11/09/2017, 11:20 AM [place] PH: [phone]   FX: [phone] [place]

## 2017-11-20 DIAGNOSIS — I10 Essential (primary) hypertension: Secondary | ICD-10-CM | POA: Diagnosis not present

## 2017-11-20 DIAGNOSIS — E782 Mixed hyperlipidemia: Secondary | ICD-10-CM | POA: Diagnosis not present

## 2017-11-20 DIAGNOSIS — E118 Type 2 diabetes mellitus with unspecified complications: Secondary | ICD-10-CM | POA: Diagnosis not present

## 2017-11-23 DIAGNOSIS — D649 Anemia, unspecified: Secondary | ICD-10-CM | POA: Diagnosis not present

## 2017-11-23 DIAGNOSIS — D638 Anemia in other chronic diseases classified elsewhere: Secondary | ICD-10-CM | POA: Diagnosis not present

## 2017-11-23 DIAGNOSIS — Z6841 Body Mass Index (BMI) 40.0 and over, adult: Secondary | ICD-10-CM | POA: Diagnosis not present

## 2017-11-23 DIAGNOSIS — I10 Essential (primary) hypertension: Secondary | ICD-10-CM | POA: Diagnosis not present

## 2017-11-23 DIAGNOSIS — I340 Nonrheumatic mitral (valve) insufficiency: Secondary | ICD-10-CM | POA: Diagnosis not present

## 2017-11-23 DIAGNOSIS — M150 Primary generalized (osteo)arthritis: Secondary | ICD-10-CM | POA: Diagnosis not present

## 2017-11-23 DIAGNOSIS — E875 Hyperkalemia: Secondary | ICD-10-CM | POA: Diagnosis not present

## 2017-11-23 DIAGNOSIS — E1122 Type 2 diabetes mellitus with diabetic chronic kidney disease: Secondary | ICD-10-CM | POA: Diagnosis not present

## 2017-11-23 DIAGNOSIS — Z0000 Encounter for general adult medical examination without abnormal findings: Secondary | ICD-10-CM | POA: Diagnosis not present

## 2017-11-23 DIAGNOSIS — N184 Chronic kidney disease, stage 4 (severe): Secondary | ICD-10-CM | POA: Diagnosis not present

## 2017-11-23 DIAGNOSIS — E2601 Conn's syndrome: Secondary | ICD-10-CM | POA: Diagnosis not present

## 2017-11-23 DIAGNOSIS — E782 Mixed hyperlipidemia: Secondary | ICD-10-CM | POA: Diagnosis not present

## 2017-11-30 ENCOUNTER — Other Ambulatory Visit: Payer: Self-pay | Admitting: Acute Care

## 2017-11-30 DIAGNOSIS — Z122 Encounter for screening for malignant neoplasm of respiratory organs: Secondary | ICD-10-CM

## 2017-11-30 DIAGNOSIS — Z87891 Personal history of nicotine dependence: Secondary | ICD-10-CM

## 2017-11-30 NOTE — Progress Notes (Signed)
[place]

## 2017-12-06 DIAGNOSIS — N189 Chronic kidney disease, unspecified: Secondary | ICD-10-CM | POA: Diagnosis not present

## 2017-12-06 DIAGNOSIS — M25552 Pain in left hip: Secondary | ICD-10-CM | POA: Diagnosis not present

## 2017-12-06 DIAGNOSIS — M1990 Unspecified osteoarthritis, unspecified site: Secondary | ICD-10-CM | POA: Diagnosis not present

## 2017-12-06 DIAGNOSIS — E669 Obesity, unspecified: Secondary | ICD-10-CM | POA: Diagnosis not present

## 2017-12-06 DIAGNOSIS — M160 Bilateral primary osteoarthritis of hip: Secondary | ICD-10-CM | POA: Diagnosis not present

## 2017-12-18 ENCOUNTER — Ambulatory Visit (INDEPENDENT_AMBULATORY_CARE_PROVIDER_SITE_OTHER): Payer: Medicare Other | Admitting: Acute Care

## 2017-12-18 ENCOUNTER — Encounter: Payer: Self-pay | Admitting: Acute Care

## 2017-12-18 ENCOUNTER — Ambulatory Visit (INDEPENDENT_AMBULATORY_CARE_PROVIDER_SITE_OTHER)
Admission: RE | Admit: 2017-12-18 | Discharge: 2017-12-18 | Disposition: A | Discharge status: Home / Self Care | Payer: Medicare Other | Source: Ambulatory Visit | Attending: Acute Care | Admitting: Acute Care

## 2017-12-18 DIAGNOSIS — Z87891 Personal history of nicotine dependence: Secondary | ICD-10-CM | POA: Diagnosis not present

## 2017-12-18 DIAGNOSIS — Z122 Encounter for screening for malignant neoplasm of respiratory organs: Secondary | ICD-10-CM

## 2017-12-18 NOTE — Progress Notes (Signed)
Shared Decision Making Visit Lung Cancer Screening Program [phone])   Eligibility:  Age 62 y.o.  Pack Years Smoking History Calculation 33.6 pack year history (# packs/per year x # years smoked)  Recent History of coughing up blood  no  Unexplained weight loss? no ( >Than 15 pounds within the last 6 months )  Prior History Lung / other cancer no (Diagnosis within the last 5 years already requiring surveillance chest CT Scans).  Smoking Status Former Smoker  Former Smokers: Years since quit: 13 years  Quit Date: 2006  Visit Components:  Discussion included one or more decision making aids. yes  Discussion included risk/benefits of screening. yes  Discussion included potential follow up diagnostic testing for abnormal scans. yes  Discussion included meaning and risk of over diagnosis. yes  Discussion included meaning and risk of False Positives. yes  Discussion included meaning of total radiation exposure. yes  Counseling Included:  Importance of adherence to annual lung cancer LDCT screening. yes  Impact of comorbidities on ability to participate in the program. yes  Ability and willingness to under diagnostic treatment. yes  Smoking Cessation Counseling:  Current Smokers:   Discussed importance of smoking cessation.NA  Information about tobacco cessation classes and interventions provided to patient. yes  Patient provided with "ticket" for LDCT Scan. yes  Symptomatic Patient. no  Counseling NA  Diagnosis Code: Tobacco Use Z72.0  Asymptomatic Patient yes  Counseling (Intermediate counseling: > three minutes counseling) I6803  Former Smokers:   Discussed the importance of maintaining cigarette abstinence. yes  Diagnosis Code: Personal History of Nicotine Dependence. O12.248  Information about tobacco cessation classes and interventions provided to patient. Yes  Patient provided with "ticket" for LDCT Scan. yes  Written Order for Lung Cancer  Screening with LDCT placed in Epic. Yes (CT Chest Lung Cancer Screening Low Dose W/O CM) [id number] Z12.2-Screening of respiratory organs Z87.891-Personal history of nicotine dependence  I spent 25 minutes of face to face time with Robin Foley and her daughter discussing the risks and benefits of lung cancer screening. We viewed a power point together that explained in detail the above noted topics. We took the time to pause the power point at intervals to allow for questions to be asked and answered to ensure understanding. We discussed that she had taken the single most powerful action possible to decrease her risk of developing lung cancer when she quit smoking. I counseled her to remain smoke free, and to contact me if she ever had the desire to smoke again so that I can provide resources and tools to help support the effort to remain smoke free. We discussed the time and location of the scan, and that either  Robin Glassman RN or I will call with the results within  24-48 hours of receiving them. She has my card and contact information in the event she needs to speak with me, in addition to a copy of the power point we reviewed as a resource. She verbalized understanding of all of the above and had no further questions upon leaving the office.     I explained to the patient that there has been a high incidence of coronary artery disease noted on these exams. I explained that this is a non-gated exam therefore degree or severity cannot be determined. This patient is currently on statin therapy. I have asked the patient to follow-up with their PCP regarding any incidental finding of coronary artery disease and management with diet or medication as they feel  is clinically indicated. The patient verbalized understanding of the above and had no further questions.     Magdalen Spatz, NP 12/18/2017 4:55 PM

## 2017-12-21 DIAGNOSIS — Z1151 Encounter for screening for human papillomavirus (HPV): Secondary | ICD-10-CM | POA: Diagnosis not present

## 2017-12-21 DIAGNOSIS — Z01419 Encounter for gynecological examination (general) (routine) without abnormal findings: Secondary | ICD-10-CM | POA: Diagnosis not present

## 2017-12-21 DIAGNOSIS — Z124 Encounter for screening for malignant neoplasm of cervix: Secondary | ICD-10-CM | POA: Diagnosis not present

## 2017-12-22 ENCOUNTER — Other Ambulatory Visit: Payer: Self-pay | Admitting: Acute Care

## 2017-12-22 DIAGNOSIS — Z122 Encounter for screening for malignant neoplasm of respiratory organs: Secondary | ICD-10-CM

## 2017-12-22 DIAGNOSIS — Z87891 Personal history of nicotine dependence: Secondary | ICD-10-CM

## 2018-01-03 ENCOUNTER — Other Ambulatory Visit: Payer: Self-pay | Admitting: Physician Assistant

## 2018-01-03 ENCOUNTER — Ambulatory Visit
Admission: RE | Admit: 2018-01-03 | Discharge: 2018-01-03 | Disposition: A | Discharge status: Home / Self Care | Payer: Medicare Other | Source: Ambulatory Visit | Attending: Physician Assistant | Admitting: Physician Assistant

## 2018-01-03 DIAGNOSIS — Z79899 Other long term (current) drug therapy: Secondary | ICD-10-CM | POA: Diagnosis not present

## 2018-01-03 DIAGNOSIS — M25562 Pain in left knee: Secondary | ICD-10-CM

## 2018-01-03 DIAGNOSIS — M1710 Unilateral primary osteoarthritis, unspecified knee: Secondary | ICD-10-CM | POA: Diagnosis not present

## 2018-01-03 DIAGNOSIS — M1712 Unilateral primary osteoarthritis, left knee: Secondary | ICD-10-CM | POA: Diagnosis not present

## 2018-01-03 DIAGNOSIS — Z79891 Long term (current) use of opiate analgesic: Secondary | ICD-10-CM | POA: Diagnosis not present

## 2018-01-03 DIAGNOSIS — G894 Chronic pain syndrome: Secondary | ICD-10-CM | POA: Diagnosis not present

## 2018-01-03 DIAGNOSIS — M169 Osteoarthritis of hip, unspecified: Secondary | ICD-10-CM | POA: Diagnosis not present

## 2018-01-09 DIAGNOSIS — Z8673 Personal history of transient ischemic attack (TIA), and cerebral infarction without residual deficits: Secondary | ICD-10-CM | POA: Diagnosis not present

## 2018-01-09 DIAGNOSIS — N39498 Other specified urinary incontinence: Secondary | ICD-10-CM | POA: Diagnosis not present

## 2018-01-09 DIAGNOSIS — R531 Weakness: Secondary | ICD-10-CM | POA: Diagnosis not present

## 2018-01-09 DIAGNOSIS — R413 Other amnesia: Secondary | ICD-10-CM | POA: Diagnosis not present

## 2018-01-09 DIAGNOSIS — R269 Unspecified abnormalities of gait and mobility: Secondary | ICD-10-CM | POA: Diagnosis not present

## 2018-01-17 ENCOUNTER — Encounter: Payer: Self-pay | Admitting: Internal Medicine

## 2018-01-17 DIAGNOSIS — Z8673 Personal history of transient ischemic attack (TIA), and cerebral infarction without residual deficits: Secondary | ICD-10-CM | POA: Diagnosis not present

## 2018-01-17 DIAGNOSIS — E1122 Type 2 diabetes mellitus with diabetic chronic kidney disease: Secondary | ICD-10-CM | POA: Diagnosis not present

## 2018-01-17 DIAGNOSIS — E041 Nontoxic single thyroid nodule: Secondary | ICD-10-CM | POA: Diagnosis not present

## 2018-01-17 DIAGNOSIS — N184 Chronic kidney disease, stage 4 (severe): Secondary | ICD-10-CM | POA: Diagnosis not present

## 2018-01-18 ENCOUNTER — Encounter: Payer: Self-pay | Admitting: Internal Medicine

## 2018-01-18 ENCOUNTER — Ambulatory Visit (INDEPENDENT_AMBULATORY_CARE_PROVIDER_SITE_OTHER): Payer: Medicare Other | Admitting: Internal Medicine

## 2018-01-18 VITALS — BP 130/68 | HR 77 | Ht 67.0 in | Wt 250.4 lb

## 2018-01-18 DIAGNOSIS — G3184 Mild cognitive impairment, so stated: Secondary | ICD-10-CM

## 2018-01-18 DIAGNOSIS — G4733 Obstructive sleep apnea (adult) (pediatric): Secondary | ICD-10-CM

## 2018-01-18 NOTE — Progress Notes (Signed)
HPI female former smoker followed for OSA complicated by Conn syndrome (hyperaldosteronism), CKD 4, DM 1, HBP, CVA/right mid brain hemorrhage, ASCVD, DM2 NPSG 08/09/11-[facility]- AHI 102/ hr, desat to 84%, CPAP inadequate and titrated to BIPAP 20/16, with residual apneas and desaturation, body weight 277 pounds. HST-04/12/17-AHI 60.4/hour, desaturation to 54%, body weight 272 pounds CPAP titration sleep study 10/25/17- 17 cwp, min sat 90% -----------------------------------------------------------------------  09/21/2017- 62 year old female former smoker followed for OSA complicated by Conn syndrome (hyperaldosteronism), CKD 4, DM 1, HBP, CVA/right mid brain hemorrhage ----OSA: DME [facility] Pt wears CPAP nightly got at least 5 hours. high AHI per DL-attached to OV notes.  Download 100% compliance AHI 16.4/hour.  Auto 5-20 with pressure range 17.1-19.9.  Moderate leak. She notices much less daytime sleepiness since she is using CPAP regularly with fewer naps.  Nasal pillows with chinstrap.  Husband says she still snores quite a bit.  01/18/2018- 62 year old female former smoker followed for OSA complicated by Conn syndrome (hyperaldosteronism), CKD 4, DM 1, HBP, CVA/right mid brain hemorrhage, ASCVD, DM2 CPAP auto 5-20/ [facility]>> today auto 10-20 CPAP titration sleep study 10/25/17- 17 cwp, min sat 90% -----OSA - discuss sleep study results Titration showed adequate sleep apnea control with CPAP 17.  Not clear yet if that will be comfortable for her-she may need BiPAP to tolerate pressures this high. She has used CPAP on only a few nights so far with inadequate control. Daytime breathing is comfortable.  She declines flu vaccine. Lung Cancer Screening CT chest program CT chest 8/113/19-  Lung-RADS 2, benign appearance or behavior. Continue annual screening with low-dose chest CT without contrast in 12 months. Aortic Atherosclerosis (ICD10-I70.0).  ROS-see HPI  + = positive Constitutional:    weight loss,  night sweats, fevers, chills,  fatigue, lassitude. HEENT:    headaches, +difficulty swallowing, tooth/dental problems, sore throat,       sneezing, itching, ear ache, nasal congestion, post nasal drip, snoring CV:    chest pain, orthopnea, PND, swelling in lower extremities, anasarca,                                  dizziness, palpitations Resp:   shortness of breath with exertion or at rest.                productive cough,   non-productive cough, coughing up of blood.              change in color of mucus.  wheezing.   Skin:    rash or lesions. GI:  No-   heartburn, indigestion, abdominal pain, nausea, vomiting, diarrhea,                 change in bowel habits, loss of appetite GU: dysuria, change in color of urine, no urgency or frequency.   flank pain. MS:   joint pain, stiffness, decreased range of motion, back pain. Neuro-     nothing unusual Psych:  change in mood or affect.  depression or anxiety.   memory loss.  OBJ- Physical Exam General- Alert, Oriented, Affect-appropriate, Distress- none acute, + morbidly obese  Skin- rash-none, lesions- none, excoriation- none Lymphadenopathy- none Head- atraumatic            Eyes- Gross vision intact, PERRLA, conjunctivae and secretions clear            Ears- Hearing, canals-normal            Nose-  Clear, no-Septal dev, mucus, polyps, erosion, perforation             Throat- Mallampati IV , mucosa clear , drainage- none, tonsils- atrophic, + dentures,                         +hoarse Neck- flexible , trachea midline, no stridor , thyroid nl, carotid no bruit Chest - symmetrical excursion , unlabored           Heart/CV- RRR , no murmur , no gallop  , no rub, nl s1 s2                           - JVD- none , edema- none, stasis changes- none, varices- none           Lung- clear to P&A, wheeze- none, cough- none , dullness-none, rub- none           Chest wall-  Abd-  Br/ Gen/ Rectal- Not done, not indicated Extrem- cyanosis- none, clubbing,  none, atrophy- none, strength- nl Neuro- + mildly slurred speech, + wheelchair

## 2018-01-18 NOTE — Patient Instructions (Signed)
Order- [facility]- please set CPAP to auto 10-20, continue mask of choice, humidifier, supplies, AirView.  Please call as needed.

## 2018-01-22 DIAGNOSIS — M160 Bilateral primary osteoarthritis of hip: Secondary | ICD-10-CM | POA: Diagnosis not present

## 2018-01-22 DIAGNOSIS — G894 Chronic pain syndrome: Secondary | ICD-10-CM | POA: Diagnosis not present

## 2018-01-22 DIAGNOSIS — M1712 Unilateral primary osteoarthritis, left knee: Secondary | ICD-10-CM | POA: Diagnosis not present

## 2018-01-23 ENCOUNTER — Other Ambulatory Visit: Payer: Self-pay | Admitting: Internal Medicine

## 2018-01-23 DIAGNOSIS — E041 Nontoxic single thyroid nodule: Secondary | ICD-10-CM

## 2018-01-24 ENCOUNTER — Other Ambulatory Visit: Payer: Self-pay | Admitting: Internal Medicine

## 2018-01-24 DIAGNOSIS — Z1231 Encounter for screening mammogram for malignant neoplasm of breast: Secondary | ICD-10-CM

## 2018-01-26 NOTE — Assessment & Plan Note (Signed)
Meaningful weight loss would help significantly but with her physical limitations increased exercise is unlikely.

## 2018-01-26 NOTE — Assessment & Plan Note (Signed)
I am not sure if she is able to fully understand the importance of effective treatment for her sleep apnea.  I made sure husband understood our discussion.

## 2018-01-26 NOTE — Assessment & Plan Note (Signed)
Husband was here as I discussed the medical importance of treating sleep apnea especially in somebody with a history of CVA.  We discussed compliance goals and also benefit of weight loss.  She is not driving. Plan-try CPAP range 10-20 auto

## 2018-01-29 ENCOUNTER — Inpatient Hospital Stay
Admission: RE | Admit: 2018-01-29 | Discharge: 2018-01-29 | Disposition: A | Discharge status: Home / Self Care | Payer: 59 | Source: Ambulatory Visit | Attending: Internal Medicine | Admitting: Internal Medicine

## 2018-01-30 ENCOUNTER — Other Ambulatory Visit: Payer: 59

## 2018-01-31 ENCOUNTER — Ambulatory Visit
Admission: RE | Admit: 2018-01-31 | Discharge: 2018-01-31 | Disposition: A | Discharge status: Home / Self Care | Payer: Medicare Other | Source: Ambulatory Visit | Attending: Internal Medicine | Admitting: Internal Medicine

## 2018-01-31 DIAGNOSIS — E041 Nontoxic single thyroid nodule: Secondary | ICD-10-CM | POA: Diagnosis not present

## 2018-02-12 DIAGNOSIS — E041 Nontoxic single thyroid nodule: Secondary | ICD-10-CM | POA: Diagnosis not present

## 2018-02-12 DIAGNOSIS — E1121 Type 2 diabetes mellitus with diabetic nephropathy: Secondary | ICD-10-CM | POA: Diagnosis not present

## 2018-02-12 DIAGNOSIS — E118 Type 2 diabetes mellitus with unspecified complications: Secondary | ICD-10-CM | POA: Diagnosis not present

## 2018-02-13 DIAGNOSIS — Z23 Encounter for immunization: Secondary | ICD-10-CM | POA: Diagnosis not present

## 2018-02-13 DIAGNOSIS — E1122 Type 2 diabetes mellitus with diabetic chronic kidney disease: Secondary | ICD-10-CM | POA: Diagnosis not present

## 2018-02-13 DIAGNOSIS — I10 Essential (primary) hypertension: Secondary | ICD-10-CM | POA: Diagnosis not present

## 2018-02-16 ENCOUNTER — Other Ambulatory Visit: Payer: Self-pay | Admitting: Internal Medicine

## 2018-02-16 DIAGNOSIS — E042 Nontoxic multinodular goiter: Secondary | ICD-10-CM

## 2018-02-21 DIAGNOSIS — I129 Hypertensive chronic kidney disease with stage 1 through stage 4 chronic kidney disease, or unspecified chronic kidney disease: Secondary | ICD-10-CM | POA: Diagnosis not present

## 2018-02-21 DIAGNOSIS — E7800 Pure hypercholesterolemia, unspecified: Secondary | ICD-10-CM | POA: Diagnosis not present

## 2018-02-21 DIAGNOSIS — I639 Cerebral infarction, unspecified: Secondary | ICD-10-CM | POA: Diagnosis not present

## 2018-02-21 DIAGNOSIS — E1122 Type 2 diabetes mellitus with diabetic chronic kidney disease: Secondary | ICD-10-CM | POA: Diagnosis not present

## 2018-02-21 DIAGNOSIS — Z6841 Body Mass Index (BMI) 40.0 and over, adult: Secondary | ICD-10-CM | POA: Diagnosis not present

## 2018-02-21 DIAGNOSIS — N183 Chronic kidney disease, stage 3 (moderate): Secondary | ICD-10-CM | POA: Diagnosis not present

## 2018-02-22 DIAGNOSIS — H35033 Hypertensive retinopathy, bilateral: Secondary | ICD-10-CM | POA: Diagnosis not present

## 2018-02-22 DIAGNOSIS — H25013 Cortical age-related cataract, bilateral: Secondary | ICD-10-CM | POA: Diagnosis not present

## 2018-02-22 DIAGNOSIS — E119 Type 2 diabetes mellitus without complications: Secondary | ICD-10-CM | POA: Diagnosis not present

## 2018-02-22 DIAGNOSIS — E113293 Type 2 diabetes mellitus with mild nonproliferative diabetic retinopathy without macular edema, bilateral: Secondary | ICD-10-CM | POA: Diagnosis not present

## 2018-02-22 DIAGNOSIS — H2513 Age-related nuclear cataract, bilateral: Secondary | ICD-10-CM | POA: Diagnosis not present

## 2018-02-28 ENCOUNTER — Ambulatory Visit
Admission: RE | Admit: 2018-02-28 | Discharge: 2018-02-28 | Disposition: A | Discharge status: Home / Self Care | Payer: Medicare Other | Source: Ambulatory Visit | Attending: Internal Medicine | Admitting: Internal Medicine

## 2018-02-28 DIAGNOSIS — Z1231 Encounter for screening mammogram for malignant neoplasm of breast: Secondary | ICD-10-CM | POA: Diagnosis not present

## 2018-03-01 DIAGNOSIS — M169 Osteoarthritis of hip, unspecified: Secondary | ICD-10-CM | POA: Diagnosis not present

## 2018-03-01 DIAGNOSIS — G894 Chronic pain syndrome: Secondary | ICD-10-CM | POA: Diagnosis not present

## 2018-03-01 DIAGNOSIS — M1712 Unilateral primary osteoarthritis, left knee: Secondary | ICD-10-CM | POA: Diagnosis not present

## 2018-03-02 ENCOUNTER — Other Ambulatory Visit: Payer: Self-pay | Admitting: Internal Medicine

## 2018-03-02 DIAGNOSIS — R928 Other abnormal and inconclusive findings on diagnostic imaging of breast: Secondary | ICD-10-CM

## 2018-03-06 ENCOUNTER — Other Ambulatory Visit: Payer: 59

## 2018-03-07 ENCOUNTER — Other Ambulatory Visit: Payer: 59

## 2018-03-14 ENCOUNTER — Ambulatory Visit
Admission: RE | Admit: 2018-03-14 | Discharge: 2018-03-14 | Disposition: A | Discharge status: Home / Self Care | Payer: Medicare Other | Source: Ambulatory Visit | Attending: Internal Medicine | Admitting: Internal Medicine

## 2018-03-14 ENCOUNTER — Other Ambulatory Visit: Payer: Self-pay | Admitting: Internal Medicine

## 2018-03-14 DIAGNOSIS — R922 Inconclusive mammogram: Secondary | ICD-10-CM | POA: Diagnosis not present

## 2018-03-14 DIAGNOSIS — N6011 Diffuse cystic mastopathy of right breast: Secondary | ICD-10-CM | POA: Diagnosis not present

## 2018-03-14 DIAGNOSIS — R928 Other abnormal and inconclusive findings on diagnostic imaging of breast: Secondary | ICD-10-CM

## 2018-03-14 DIAGNOSIS — N6012 Diffuse cystic mastopathy of left breast: Secondary | ICD-10-CM | POA: Diagnosis not present

## 2018-03-19 DIAGNOSIS — E041 Nontoxic single thyroid nodule: Secondary | ICD-10-CM | POA: Diagnosis not present

## 2018-03-19 DIAGNOSIS — E1122 Type 2 diabetes mellitus with diabetic chronic kidney disease: Secondary | ICD-10-CM | POA: Diagnosis not present

## 2018-03-19 DIAGNOSIS — Z8673 Personal history of transient ischemic attack (TIA), and cerebral infarction without residual deficits: Secondary | ICD-10-CM | POA: Diagnosis not present

## 2018-03-19 DIAGNOSIS — N184 Chronic kidney disease, stage 4 (severe): Secondary | ICD-10-CM | POA: Diagnosis not present

## 2018-03-29 DIAGNOSIS — M169 Osteoarthritis of hip, unspecified: Secondary | ICD-10-CM | POA: Diagnosis not present

## 2018-04-03 ENCOUNTER — Ambulatory Visit
Admission: RE | Admit: 2018-04-03 | Discharge: 2018-04-03 | Disposition: A | Discharge status: Home / Self Care | Payer: Medicare Other | Source: Ambulatory Visit | Attending: Internal Medicine | Admitting: Internal Medicine

## 2018-04-03 ENCOUNTER — Other Ambulatory Visit (HOSPITAL_COMMUNITY)
Admission: RE | Admit: 2018-04-03 | Discharge: 2018-04-03 | Disposition: A | Discharge status: Home / Self Care | Payer: Medicare Other | Source: Ambulatory Visit | Attending: Physician Assistant | Admitting: Physician Assistant

## 2018-04-03 DIAGNOSIS — E042 Nontoxic multinodular goiter: Secondary | ICD-10-CM | POA: Insufficient documentation

## 2018-04-03 DIAGNOSIS — E041 Nontoxic single thyroid nodule: Secondary | ICD-10-CM | POA: Diagnosis not present

## 2018-04-19 ENCOUNTER — Ambulatory Visit: Payer: 59 | Admitting: Internal Medicine

## 2018-04-26 ENCOUNTER — Other Ambulatory Visit: Payer: Self-pay | Admitting: Internal Medicine

## 2018-04-26 DIAGNOSIS — E041 Nontoxic single thyroid nodule: Secondary | ICD-10-CM

## 2018-05-10 DIAGNOSIS — M1710 Unilateral primary osteoarthritis, unspecified knee: Secondary | ICD-10-CM | POA: Diagnosis not present

## 2018-05-10 DIAGNOSIS — M169 Osteoarthritis of hip, unspecified: Secondary | ICD-10-CM | POA: Diagnosis not present

## 2018-05-10 DIAGNOSIS — M1712 Unilateral primary osteoarthritis, left knee: Secondary | ICD-10-CM | POA: Diagnosis not present

## 2018-05-10 DIAGNOSIS — G894 Chronic pain syndrome: Secondary | ICD-10-CM | POA: Diagnosis not present

## 2018-05-29 DIAGNOSIS — E1122 Type 2 diabetes mellitus with diabetic chronic kidney disease: Secondary | ICD-10-CM | POA: Diagnosis not present

## 2018-05-29 DIAGNOSIS — G4733 Obstructive sleep apnea (adult) (pediatric): Secondary | ICD-10-CM | POA: Diagnosis not present

## 2018-05-29 DIAGNOSIS — I10 Essential (primary) hypertension: Secondary | ICD-10-CM | POA: Diagnosis not present

## 2018-06-18 DIAGNOSIS — E041 Nontoxic single thyroid nodule: Secondary | ICD-10-CM | POA: Diagnosis not present

## 2018-06-18 DIAGNOSIS — N184 Chronic kidney disease, stage 4 (severe): Secondary | ICD-10-CM | POA: Diagnosis not present

## 2018-06-18 DIAGNOSIS — Z8673 Personal history of transient ischemic attack (TIA), and cerebral infarction without residual deficits: Secondary | ICD-10-CM | POA: Diagnosis not present

## 2018-06-18 DIAGNOSIS — E118 Type 2 diabetes mellitus with unspecified complications: Secondary | ICD-10-CM | POA: Diagnosis not present

## 2018-06-18 DIAGNOSIS — E1122 Type 2 diabetes mellitus with diabetic chronic kidney disease: Secondary | ICD-10-CM | POA: Diagnosis not present

## 2018-07-23 DIAGNOSIS — E118 Type 2 diabetes mellitus with unspecified complications: Secondary | ICD-10-CM | POA: Diagnosis not present

## 2018-07-23 DIAGNOSIS — E041 Nontoxic single thyroid nodule: Secondary | ICD-10-CM | POA: Diagnosis not present

## 2018-11-08 DIAGNOSIS — M160 Bilateral primary osteoarthritis of hip: Secondary | ICD-10-CM | POA: Diagnosis not present

## 2018-11-08 DIAGNOSIS — M1712 Unilateral primary osteoarthritis, left knee: Secondary | ICD-10-CM | POA: Diagnosis not present

## 2018-11-08 DIAGNOSIS — G894 Chronic pain syndrome: Secondary | ICD-10-CM | POA: Diagnosis not present

## 2019-01-23 ENCOUNTER — Ambulatory Visit: Payer: Medicare Other

## 2019-02-06 ENCOUNTER — Other Ambulatory Visit: Payer: Self-pay | Admitting: Acute Care

## 2019-02-06 DIAGNOSIS — Z87891 Personal history of nicotine dependence: Secondary | ICD-10-CM

## 2019-02-12 DIAGNOSIS — I10 Essential (primary) hypertension: Secondary | ICD-10-CM | POA: Diagnosis not present

## 2019-02-12 DIAGNOSIS — E118 Type 2 diabetes mellitus with unspecified complications: Secondary | ICD-10-CM | POA: Diagnosis not present

## 2019-02-12 DIAGNOSIS — Z1159 Encounter for screening for other viral diseases: Secondary | ICD-10-CM | POA: Diagnosis not present

## 2019-02-19 DIAGNOSIS — E782 Mixed hyperlipidemia: Secondary | ICD-10-CM | POA: Diagnosis not present

## 2019-02-19 DIAGNOSIS — Z8673 Personal history of transient ischemic attack (TIA), and cerebral infarction without residual deficits: Secondary | ICD-10-CM | POA: Diagnosis not present

## 2019-02-19 DIAGNOSIS — G4733 Obstructive sleep apnea (adult) (pediatric): Secondary | ICD-10-CM | POA: Diagnosis not present

## 2019-02-19 DIAGNOSIS — E1122 Type 2 diabetes mellitus with diabetic chronic kidney disease: Secondary | ICD-10-CM | POA: Diagnosis not present

## 2019-02-19 DIAGNOSIS — E041 Nontoxic single thyroid nodule: Secondary | ICD-10-CM | POA: Diagnosis not present

## 2019-02-19 DIAGNOSIS — N184 Chronic kidney disease, stage 4 (severe): Secondary | ICD-10-CM | POA: Diagnosis not present

## 2019-02-25 ENCOUNTER — Other Ambulatory Visit: Payer: Self-pay | Admitting: Internal Medicine

## 2019-02-25 DIAGNOSIS — E041 Nontoxic single thyroid nodule: Secondary | ICD-10-CM

## 2019-02-26 DIAGNOSIS — H35033 Hypertensive retinopathy, bilateral: Secondary | ICD-10-CM | POA: Diagnosis not present

## 2019-02-26 DIAGNOSIS — H25013 Cortical age-related cataract, bilateral: Secondary | ICD-10-CM | POA: Diagnosis not present

## 2019-02-26 DIAGNOSIS — H2513 Age-related nuclear cataract, bilateral: Secondary | ICD-10-CM | POA: Diagnosis not present

## 2019-02-26 DIAGNOSIS — E113293 Type 2 diabetes mellitus with mild nonproliferative diabetic retinopathy without macular edema, bilateral: Secondary | ICD-10-CM | POA: Diagnosis not present

## 2019-02-28 ENCOUNTER — Other Ambulatory Visit: Payer: Medicare Other

## 2019-03-04 ENCOUNTER — Other Ambulatory Visit: Payer: Self-pay | Admitting: *Deleted

## 2019-03-04 DIAGNOSIS — Z87891 Personal history of nicotine dependence: Secondary | ICD-10-CM

## 2019-03-04 DIAGNOSIS — Z122 Encounter for screening for malignant neoplasm of respiratory organs: Secondary | ICD-10-CM

## 2019-03-11 ENCOUNTER — Other Ambulatory Visit: Payer: Self-pay | Admitting: *Deleted

## 2019-03-19 ENCOUNTER — Inpatient Hospital Stay: Admission: RE | Admit: 2019-03-19 | Payer: Medicare Other | Source: Ambulatory Visit

## 2019-03-26 ENCOUNTER — Ambulatory Visit
Admission: RE | Admit: 2019-03-26 | Discharge: 2019-03-26 | Disposition: A | Discharge status: Home / Self Care | Payer: Medicare Other | Source: Ambulatory Visit | Attending: Acute Care | Admitting: Acute Care

## 2019-03-26 ENCOUNTER — Ambulatory Visit
Admission: RE | Admit: 2019-03-26 | Discharge: 2019-03-26 | Disposition: A | Discharge status: Home / Self Care | Payer: 59 | Source: Ambulatory Visit | Attending: Internal Medicine | Admitting: Internal Medicine

## 2019-03-26 DIAGNOSIS — E041 Nontoxic single thyroid nodule: Secondary | ICD-10-CM

## 2019-03-26 DIAGNOSIS — Z87891 Personal history of nicotine dependence: Secondary | ICD-10-CM

## 2019-03-26 DIAGNOSIS — Z122 Encounter for screening for malignant neoplasm of respiratory organs: Secondary | ICD-10-CM

## 2019-03-27 DIAGNOSIS — I10 Essential (primary) hypertension: Secondary | ICD-10-CM | POA: Diagnosis not present

## 2019-03-27 DIAGNOSIS — Z1159 Encounter for screening for other viral diseases: Secondary | ICD-10-CM | POA: Diagnosis not present

## 2019-03-27 DIAGNOSIS — E118 Type 2 diabetes mellitus with unspecified complications: Secondary | ICD-10-CM | POA: Diagnosis not present

## 2019-04-02 DIAGNOSIS — E782 Mixed hyperlipidemia: Secondary | ICD-10-CM | POA: Diagnosis not present

## 2019-04-02 DIAGNOSIS — E1122 Type 2 diabetes mellitus with diabetic chronic kidney disease: Secondary | ICD-10-CM | POA: Diagnosis not present

## 2019-04-02 DIAGNOSIS — G4733 Obstructive sleep apnea (adult) (pediatric): Secondary | ICD-10-CM | POA: Diagnosis not present

## 2019-04-02 DIAGNOSIS — E041 Nontoxic single thyroid nodule: Secondary | ICD-10-CM | POA: Diagnosis not present

## 2019-04-02 DIAGNOSIS — N184 Chronic kidney disease, stage 4 (severe): Secondary | ICD-10-CM | POA: Diagnosis not present

## 2019-04-02 DIAGNOSIS — Z8673 Personal history of transient ischemic attack (TIA), and cerebral infarction without residual deficits: Secondary | ICD-10-CM | POA: Diagnosis not present

## 2019-04-09 ENCOUNTER — Other Ambulatory Visit: Payer: Self-pay | Admitting: *Deleted

## 2019-04-09 DIAGNOSIS — Z87891 Personal history of nicotine dependence: Secondary | ICD-10-CM

## 2019-04-09 DIAGNOSIS — Z122 Encounter for screening for malignant neoplasm of respiratory organs: Secondary | ICD-10-CM

## 2019-06-03 DIAGNOSIS — Z6841 Body Mass Index (BMI) 40.0 and over, adult: Secondary | ICD-10-CM | POA: Diagnosis not present

## 2019-06-03 DIAGNOSIS — I639 Cerebral infarction, unspecified: Secondary | ICD-10-CM | POA: Diagnosis not present

## 2019-06-03 DIAGNOSIS — E1122 Type 2 diabetes mellitus with diabetic chronic kidney disease: Secondary | ICD-10-CM | POA: Diagnosis not present

## 2019-06-03 DIAGNOSIS — E7800 Pure hypercholesterolemia, unspecified: Secondary | ICD-10-CM | POA: Diagnosis not present

## 2019-06-03 DIAGNOSIS — I129 Hypertensive chronic kidney disease with stage 1 through stage 4 chronic kidney disease, or unspecified chronic kidney disease: Secondary | ICD-10-CM | POA: Diagnosis not present

## 2019-06-03 DIAGNOSIS — R32 Unspecified urinary incontinence: Secondary | ICD-10-CM | POA: Diagnosis not present

## 2019-06-03 DIAGNOSIS — N183 Chronic kidney disease, stage 3 unspecified: Secondary | ICD-10-CM | POA: Diagnosis not present

## 2019-06-05 IMAGING — US US THYROID
1 series · 13 of 25 positions shown · non-contrast
Comparison: Prior CT scan of the chest 12/18/2017

CLINICAL DATA: Incidental on CT. 62-year-old female with thyroid
nodule seen on prior chest CT

EXAM:
THYROID ULTRASOUND
TECHNIQUE: Ultrasound examination of the thyroid gland and adjacent soft
tissues was performed.

[Series 1: us thyroid · 0.07mm/px · 13 of 58 slices shown]
[im 1/58]
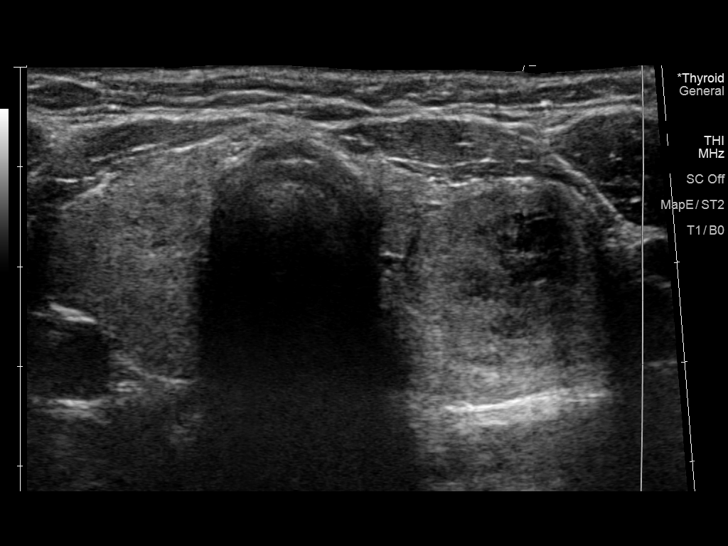
[im 5/58]
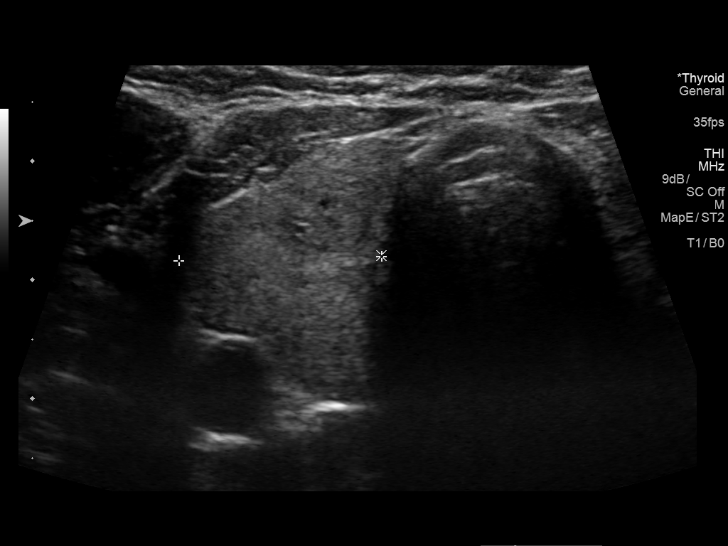
[im 10/58]
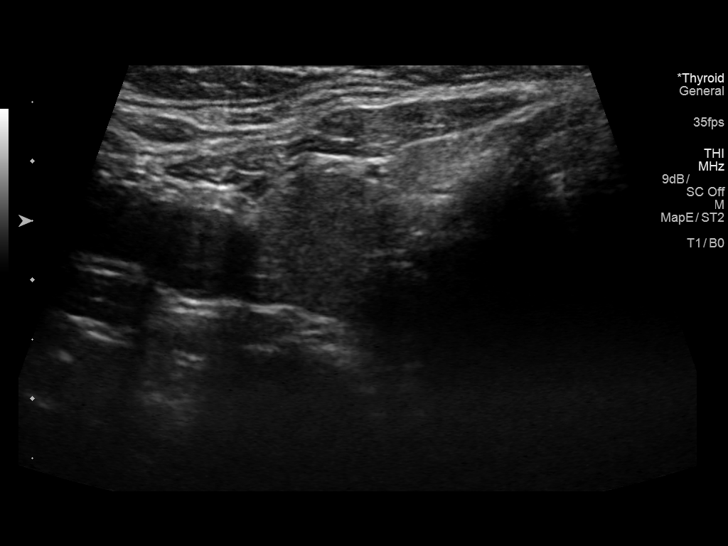
[im 15/58]
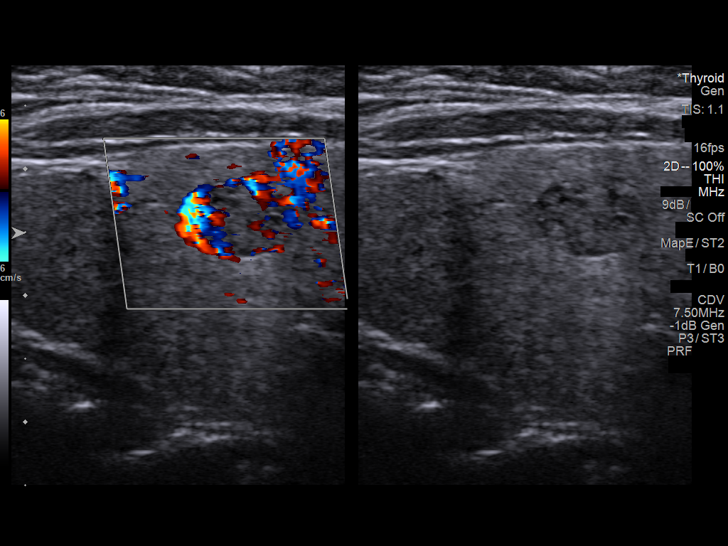
[im 20/58]
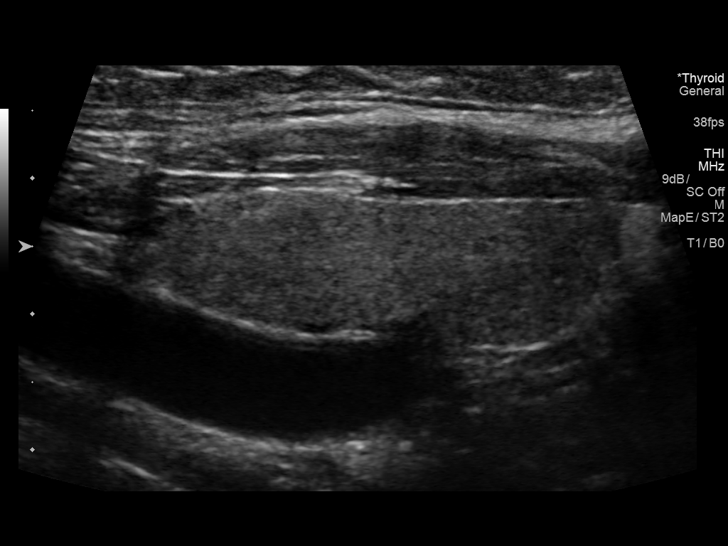
[im 24/58]
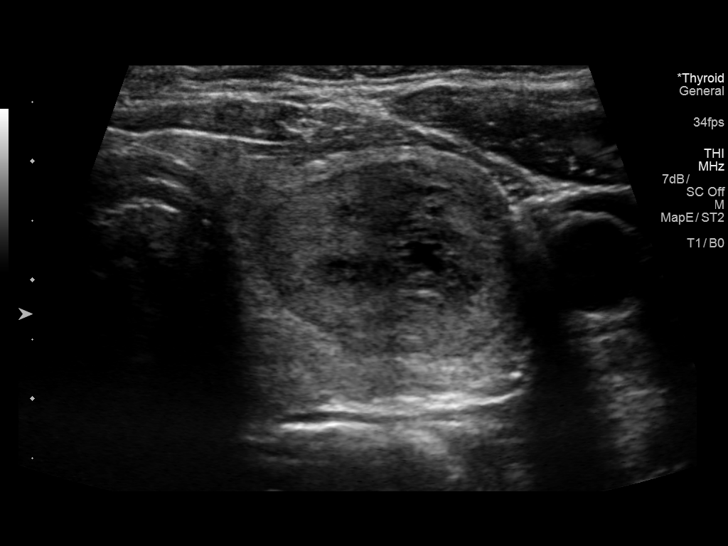
[im 29/58]
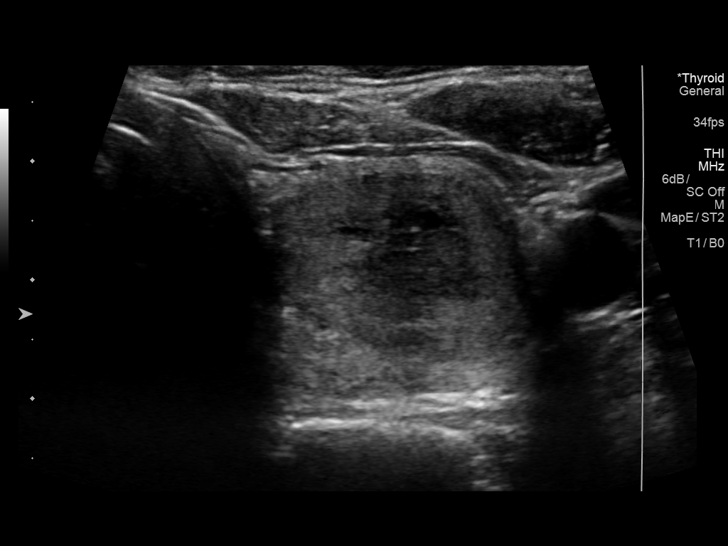
[im 34/58]
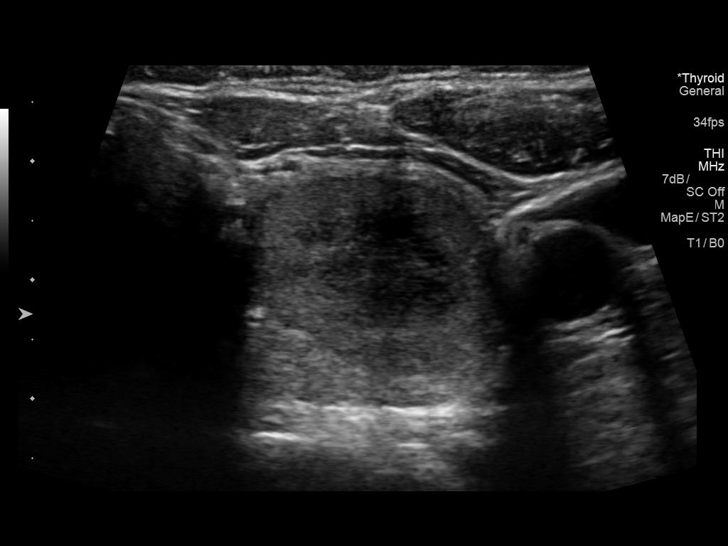
[im 39/58]
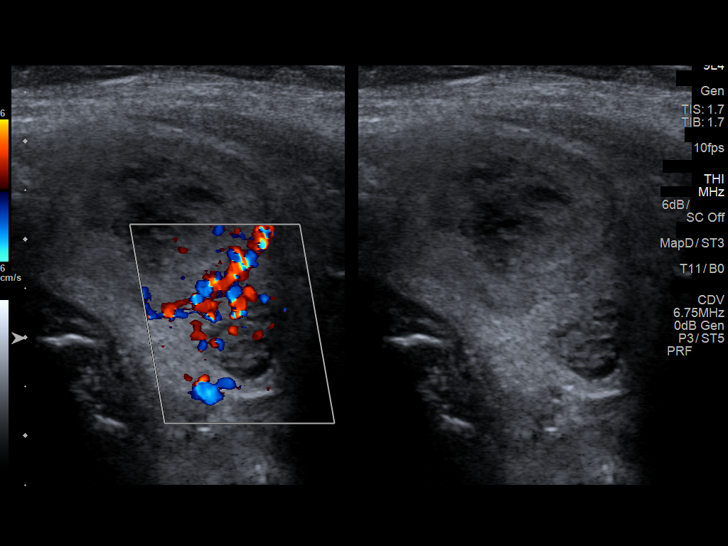
[im 43/58]
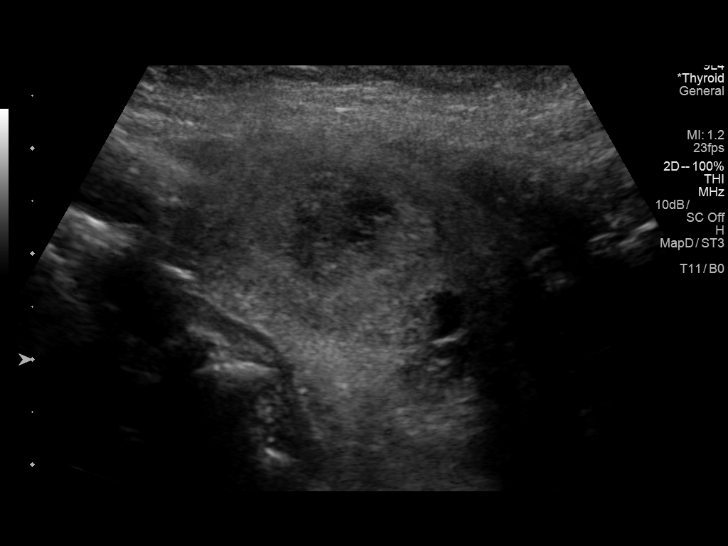
[im 48/58]
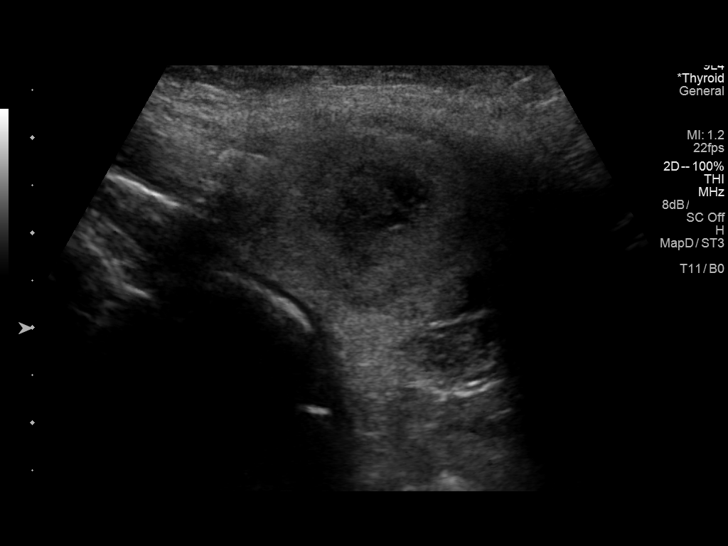
[im 53/58]
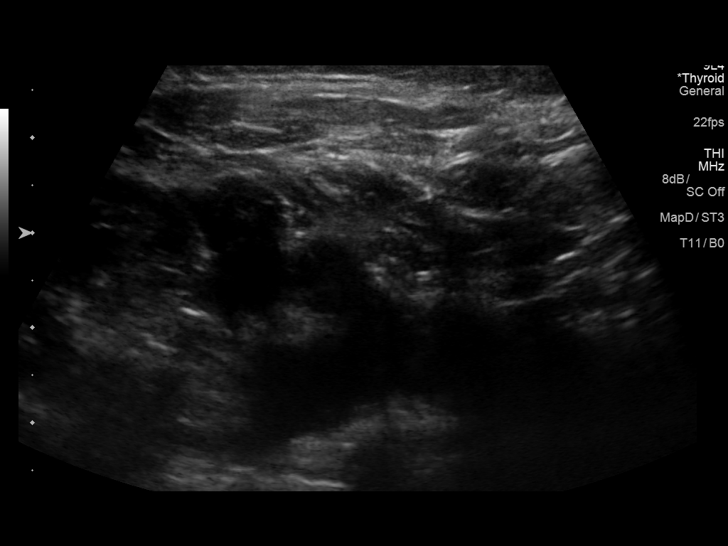
[im 58/58]
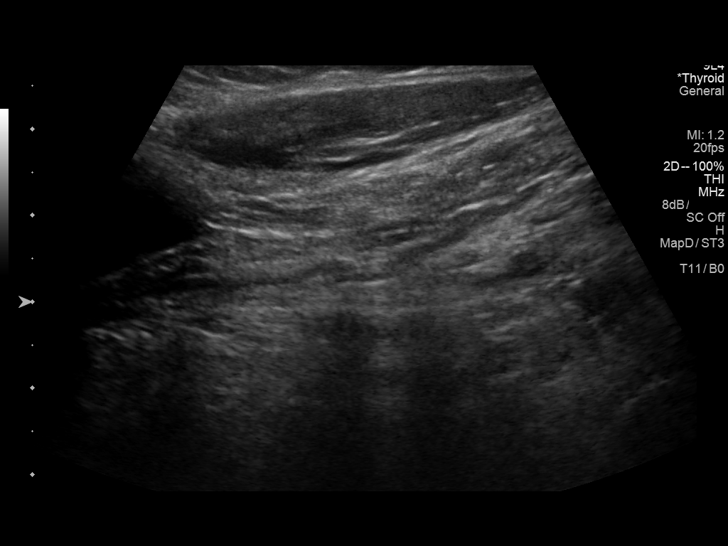

[13 of 25 positions shown; findings below may reference images not displayed]

FINDINGS: Parenchymal Echotexture: Mildly heterogenous

Isthmus: 0.5 cm

Right lobe: 5.0 x 2.1 x 1.7 cm

Left lobe: 4.6 x 2.4 x 2.6 cm

_________________________________________________________

Estimated total number of nodules >/= 1 cm: 2

Number of spongiform nodules >/=  2 cm not described below (TR1): 0

Number of mixed cystic and solid nodules >/= 1.5 cm not described
below (TR2): 0

_________________________________________________________

Nodule # 1:

Location: Left; Mid

Maximum size: 2.4 cm; Other 2 dimensions: 2.1 x 1.6 cm

Composition: solid/almost completely solid (2)

Echogenicity: hypoechoic (2)

Shape: not taller-than-wide (0)

Margins: smooth (0)

Echogenic foci: none (0)

ACR TI-RADS total points: 4.

ACR TI-RADS risk category: TR4 (4-6 points).

ACR TI-RADS recommendations:

**Given size (>/= 1.5 cm) and appearance, fine needle aspiration of
this moderately suspicious nodule should be considered based on
TI-RADS criteria.

_________________________________________________________

Nodule # 2:

Location: Left; Inferior

Maximum size: 1.0 cm; Other 2 dimensions: 0.8 x 0.7 cm

Composition: solid/almost completely solid (2)

Echogenicity: hypoechoic (2)

Shape: not taller-than-wide (0)

Margins: ill-defined (0)

Echogenic foci: none (0)

ACR TI-RADS total points: 4.

ACR TI-RADS risk category: TR4 (4-6 points).

ACR TI-RADS recommendations:

*Given size (>/= 1 - 1.4 cm) and appearance, a follow-up ultrasound
in 1 year should be considered based on TI-RADS criteria.

_________________________________________________________
IMPRESSION: 1. The 2.4 cm TI-RADS category 4 nodule (labeled # 2) in the left
mid gland meets criteria for consideration of fine-needle aspiration
biopsy.
2. A 1.0 cm TI-RADS category 4 nodule (labeled # 3) in the left
inferior gland meets criteria for follow-up ultrasound in 1 year.

The above is in keeping with the ACR TI-RADS recommendations - [HOSPITAL] [id number];[DATE].

## 2019-06-12 DIAGNOSIS — E041 Nontoxic single thyroid nodule: Secondary | ICD-10-CM | POA: Diagnosis not present

## 2019-06-12 DIAGNOSIS — E1122 Type 2 diabetes mellitus with diabetic chronic kidney disease: Secondary | ICD-10-CM | POA: Diagnosis not present

## 2019-06-18 DIAGNOSIS — E041 Nontoxic single thyroid nodule: Secondary | ICD-10-CM | POA: Diagnosis not present

## 2019-06-18 DIAGNOSIS — E782 Mixed hyperlipidemia: Secondary | ICD-10-CM | POA: Diagnosis not present

## 2019-06-18 DIAGNOSIS — Z8673 Personal history of transient ischemic attack (TIA), and cerebral infarction without residual deficits: Secondary | ICD-10-CM | POA: Diagnosis not present

## 2019-06-18 DIAGNOSIS — E1122 Type 2 diabetes mellitus with diabetic chronic kidney disease: Secondary | ICD-10-CM | POA: Diagnosis not present

## 2019-06-18 DIAGNOSIS — Z6838 Body mass index (BMI) 38.0-38.9, adult: Secondary | ICD-10-CM | POA: Diagnosis not present

## 2019-06-18 DIAGNOSIS — G4733 Obstructive sleep apnea (adult) (pediatric): Secondary | ICD-10-CM | POA: Diagnosis not present

## 2019-06-18 DIAGNOSIS — N184 Chronic kidney disease, stage 4 (severe): Secondary | ICD-10-CM | POA: Diagnosis not present

## 2019-10-03 DIAGNOSIS — E1122 Type 2 diabetes mellitus with diabetic chronic kidney disease: Secondary | ICD-10-CM | POA: Diagnosis not present

## 2019-10-03 DIAGNOSIS — I10 Essential (primary) hypertension: Secondary | ICD-10-CM | POA: Diagnosis not present

## 2019-10-03 DIAGNOSIS — E118 Type 2 diabetes mellitus with unspecified complications: Secondary | ICD-10-CM | POA: Diagnosis not present

## 2019-10-10 DIAGNOSIS — J439 Emphysema, unspecified: Secondary | ICD-10-CM | POA: Diagnosis not present

## 2019-10-10 DIAGNOSIS — Z8673 Personal history of transient ischemic attack (TIA), and cerebral infarction without residual deficits: Secondary | ICD-10-CM | POA: Diagnosis not present

## 2019-10-10 DIAGNOSIS — N184 Chronic kidney disease, stage 4 (severe): Secondary | ICD-10-CM | POA: Diagnosis not present

## 2019-10-10 DIAGNOSIS — E1122 Type 2 diabetes mellitus with diabetic chronic kidney disease: Secondary | ICD-10-CM | POA: Diagnosis not present

## 2019-10-10 DIAGNOSIS — G4733 Obstructive sleep apnea (adult) (pediatric): Secondary | ICD-10-CM | POA: Diagnosis not present

## 2019-10-10 DIAGNOSIS — Z0001 Encounter for general adult medical examination with abnormal findings: Secondary | ICD-10-CM | POA: Diagnosis not present

## 2019-10-10 DIAGNOSIS — I700 Atherosclerosis of aorta: Secondary | ICD-10-CM | POA: Diagnosis not present

## 2019-10-10 DIAGNOSIS — I10 Essential (primary) hypertension: Secondary | ICD-10-CM | POA: Diagnosis not present

## 2019-10-10 DIAGNOSIS — E2601 Conn's syndrome: Secondary | ICD-10-CM | POA: Diagnosis not present

## 2019-10-15 DIAGNOSIS — Z8673 Personal history of transient ischemic attack (TIA), and cerebral infarction without residual deficits: Secondary | ICD-10-CM | POA: Diagnosis not present

## 2019-10-15 DIAGNOSIS — E041 Nontoxic single thyroid nodule: Secondary | ICD-10-CM | POA: Diagnosis not present

## 2019-10-15 DIAGNOSIS — N184 Chronic kidney disease, stage 4 (severe): Secondary | ICD-10-CM | POA: Diagnosis not present

## 2019-10-15 DIAGNOSIS — E782 Mixed hyperlipidemia: Secondary | ICD-10-CM | POA: Diagnosis not present

## 2019-10-15 DIAGNOSIS — E1122 Type 2 diabetes mellitus with diabetic chronic kidney disease: Secondary | ICD-10-CM | POA: Diagnosis not present

## 2019-10-15 DIAGNOSIS — Z6838 Body mass index (BMI) 38.0-38.9, adult: Secondary | ICD-10-CM | POA: Diagnosis not present

## 2019-10-15 DIAGNOSIS — G4733 Obstructive sleep apnea (adult) (pediatric): Secondary | ICD-10-CM | POA: Diagnosis not present

## 2019-10-15 DIAGNOSIS — M8589 Other specified disorders of bone density and structure, multiple sites: Secondary | ICD-10-CM | POA: Diagnosis not present

## 2020-03-03 DIAGNOSIS — H25013 Cortical age-related cataract, bilateral: Secondary | ICD-10-CM | POA: Diagnosis not present

## 2020-03-03 DIAGNOSIS — E119 Type 2 diabetes mellitus without complications: Secondary | ICD-10-CM | POA: Diagnosis not present

## 2020-03-03 DIAGNOSIS — H2513 Age-related nuclear cataract, bilateral: Secondary | ICD-10-CM | POA: Diagnosis not present

## 2020-03-03 DIAGNOSIS — H35033 Hypertensive retinopathy, bilateral: Secondary | ICD-10-CM | POA: Diagnosis not present

## 2020-03-17 ENCOUNTER — Other Ambulatory Visit: Payer: Self-pay | Admitting: Internal Medicine

## 2020-03-17 DIAGNOSIS — E041 Nontoxic single thyroid nodule: Secondary | ICD-10-CM

## 2020-03-17 IMAGING — CT CT CHEST LUNG CANCER SCREENING LOW DOSE W/O CM
1 of 3 series · 10 of 30 positions shown, 13 images · non-contrast
Comparison: None.

CLINICAL DATA: 62-year-old female former smoker, quit 13 years ago,
with 34 pack-year history of smoking, for initial lung cancer
screening

EXAM:
CT CHEST WITHOUT CONTRAST LOW-DOSE FOR LUNG CANCER SCREENING
TECHNIQUE: Multidetector CT imaging of the chest was performed following the
standard protocol without IV contrast.

[ct lung segmentation data · axial · 0.78mm/px · z∈[-344,-344]mm · 10 of 327 frames shown]
[frame 1/327  mediastinal]
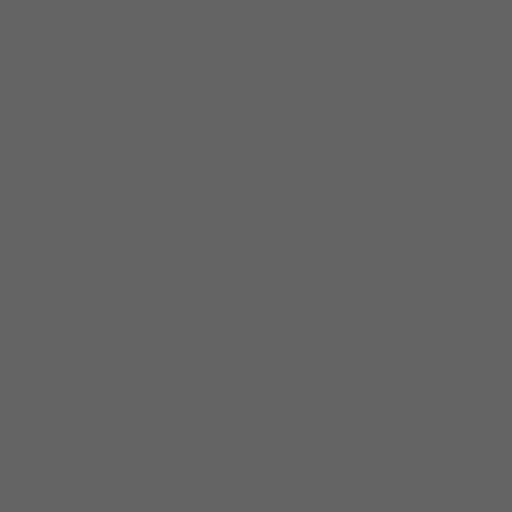
[frame 1/327  lung]
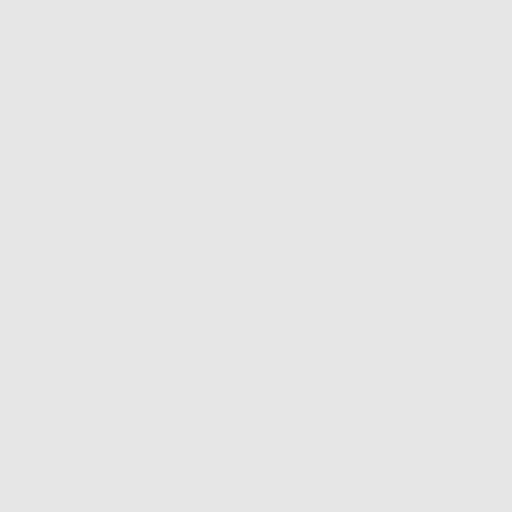
[frame 37/327  lung]
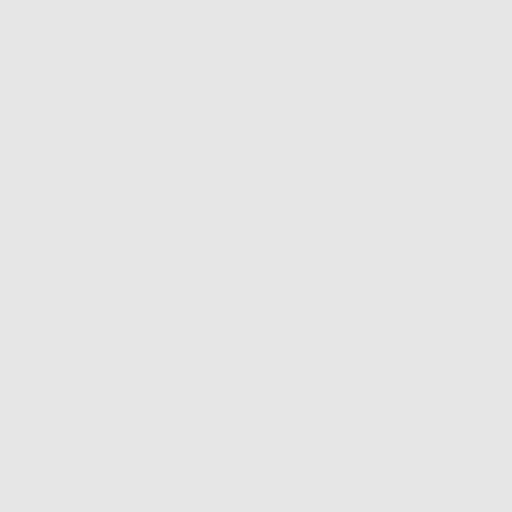
[frame 73/327  lung]
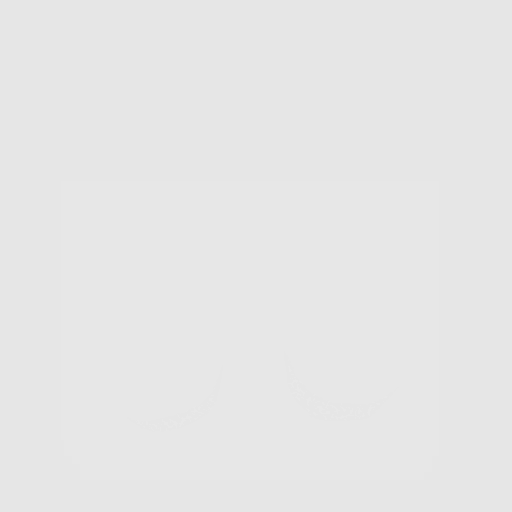
[frame 109/327  lung]
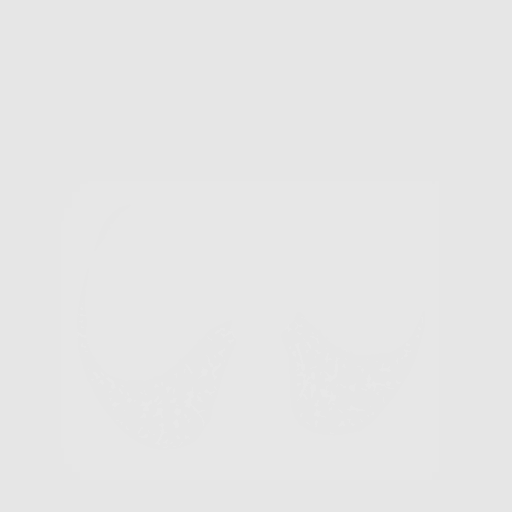
[frame 145/327  mediastinal]
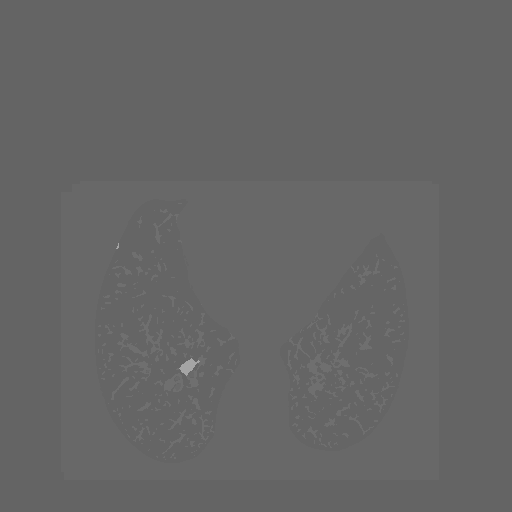
[frame 145/327  lung]
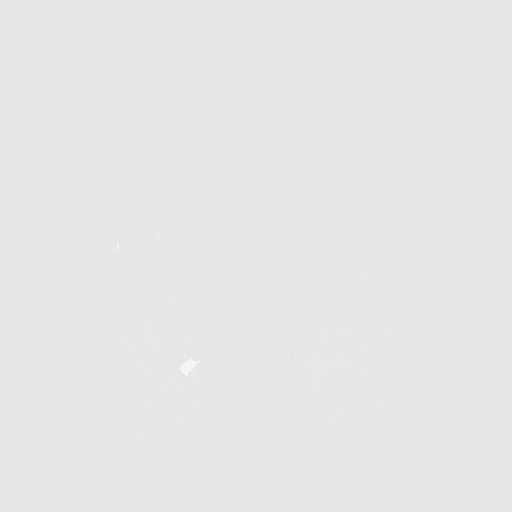
[frame 182/327  lung]
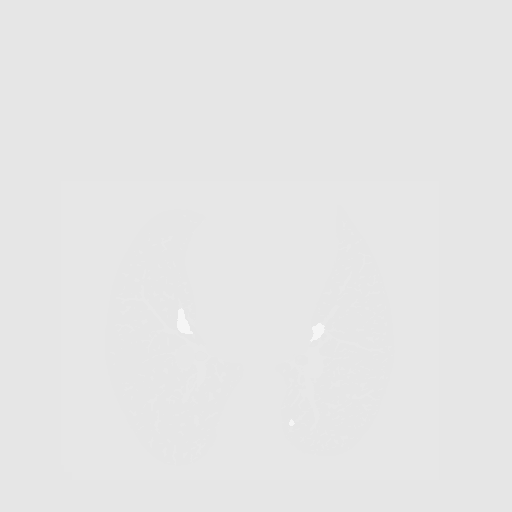
[frame 218/327  lung]
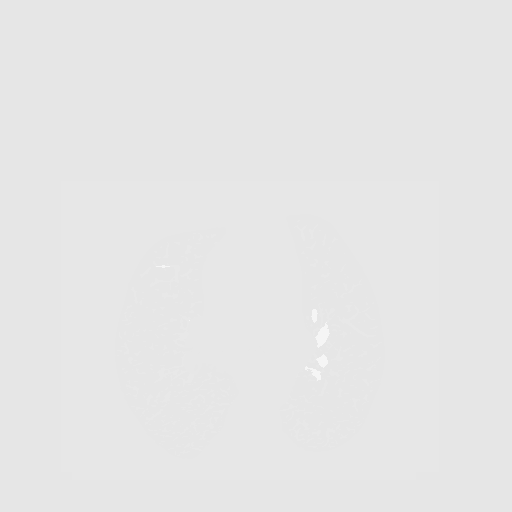
[frame 254/327  lung]
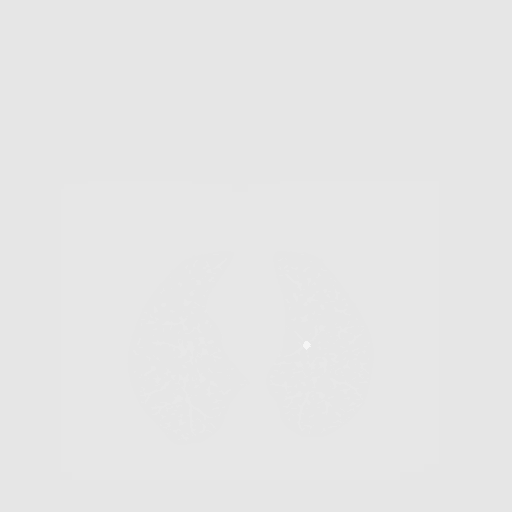
[frame 290/327  mediastinal]
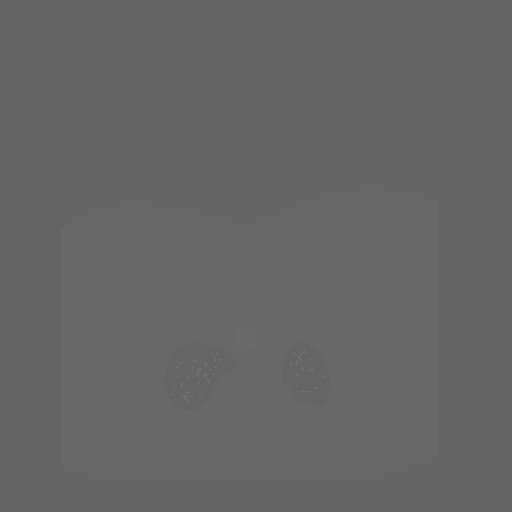
[frame 290/327  lung]
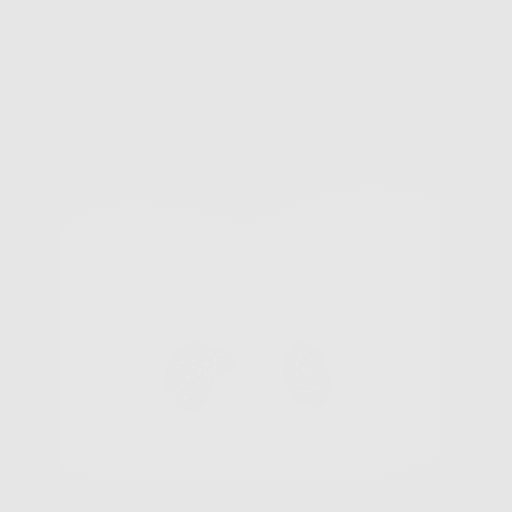
[frame 327/327  lung]
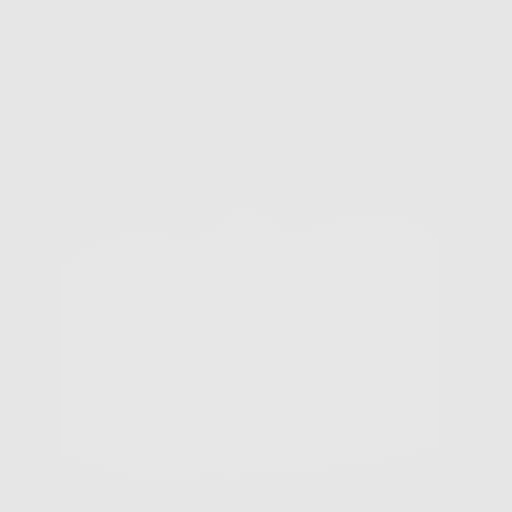

[10 of 30 positions shown; findings below may reference images not displayed]

FINDINGS: Cardiovascular: The heart is normal in size. No pericardial
effusion.

No evidence of thoracic aortic aneurysm. Very mild atherosclerotic
calcifications of the aortic arch.

Mediastinum/Nodes: Small mediastinal lymph nodes which do not meet
pathologic CT size criteria.

2.2 cm left thyroid nodule.

Lungs/Pleura: Mild left apical pleural-parenchymal scarring.

Mild centrilobular emphysematous changes, upper lobe predominant.

No focal consolidation.

3.4 mm subpleural right upper lobe nodule. Subpleural left lower
lobe nodules measuring up to 4.5 mm.

No pleural effusion or pneumothorax.

Upper Abdomen: Visualized upper abdomen is grossly unremarkable.

Musculoskeletal: Visualized osseous structures are within normal
limits.
IMPRESSION: Lung-RADS 2, benign appearance or behavior. Continue annual
screening with low-dose chest CT without contrast in 12 months.

Aortic Atherosclerosis ([id number]).

## 2020-03-25 ENCOUNTER — Ambulatory Visit: Payer: 59

## 2020-03-26 ENCOUNTER — Ambulatory Visit
Admission: RE | Admit: 2020-03-26 | Discharge: 2020-03-26 | Disposition: A | Discharge status: Home / Self Care | Payer: 59 | Source: Ambulatory Visit | Attending: Acute Care | Admitting: Acute Care

## 2020-03-26 DIAGNOSIS — Z122 Encounter for screening for malignant neoplasm of respiratory organs: Secondary | ICD-10-CM

## 2020-03-26 DIAGNOSIS — I2510 Atherosclerotic heart disease of native coronary artery without angina pectoris: Secondary | ICD-10-CM | POA: Diagnosis not present

## 2020-03-26 DIAGNOSIS — Z87891 Personal history of nicotine dependence: Secondary | ICD-10-CM | POA: Diagnosis not present

## 2020-03-26 DIAGNOSIS — E041 Nontoxic single thyroid nodule: Secondary | ICD-10-CM | POA: Diagnosis not present

## 2020-03-26 DIAGNOSIS — J432 Centrilobular emphysema: Secondary | ICD-10-CM | POA: Diagnosis not present

## 2020-03-27 NOTE — Progress Notes (Signed)

## 2020-03-30 ENCOUNTER — Other Ambulatory Visit: Payer: 59

## 2020-03-31 ENCOUNTER — Other Ambulatory Visit: Payer: Self-pay | Admitting: *Deleted

## 2020-03-31 DIAGNOSIS — Z87891 Personal history of nicotine dependence: Secondary | ICD-10-CM

## 2020-04-07 ENCOUNTER — Telehealth: Payer: Self-pay

## 2020-04-07 NOTE — Telephone Encounter (Signed)
NOTES ON FILE FROM [facility] [phone], SENT REFERRAL TO SCHEDULING 

## 2020-04-09 DIAGNOSIS — I700 Atherosclerosis of aorta: Secondary | ICD-10-CM | POA: Diagnosis not present

## 2020-04-09 DIAGNOSIS — I10 Essential (primary) hypertension: Secondary | ICD-10-CM | POA: Diagnosis not present

## 2020-04-09 DIAGNOSIS — J439 Emphysema, unspecified: Secondary | ICD-10-CM | POA: Diagnosis not present

## 2020-04-09 DIAGNOSIS — N184 Chronic kidney disease, stage 4 (severe): Secondary | ICD-10-CM | POA: Diagnosis not present

## 2020-04-09 DIAGNOSIS — E1122 Type 2 diabetes mellitus with diabetic chronic kidney disease: Secondary | ICD-10-CM | POA: Diagnosis not present

## 2020-04-09 DIAGNOSIS — I2510 Atherosclerotic heart disease of native coronary artery without angina pectoris: Secondary | ICD-10-CM | POA: Diagnosis not present

## 2020-04-09 DIAGNOSIS — Z23 Encounter for immunization: Secondary | ICD-10-CM | POA: Diagnosis not present

## 2020-04-15 DIAGNOSIS — E041 Nontoxic single thyroid nodule: Secondary | ICD-10-CM | POA: Diagnosis not present

## 2020-04-15 DIAGNOSIS — Z8673 Personal history of transient ischemic attack (TIA), and cerebral infarction without residual deficits: Secondary | ICD-10-CM | POA: Diagnosis not present

## 2020-04-15 DIAGNOSIS — G4733 Obstructive sleep apnea (adult) (pediatric): Secondary | ICD-10-CM | POA: Diagnosis not present

## 2020-04-15 DIAGNOSIS — E1122 Type 2 diabetes mellitus with diabetic chronic kidney disease: Secondary | ICD-10-CM | POA: Diagnosis not present

## 2020-04-15 DIAGNOSIS — N184 Chronic kidney disease, stage 4 (severe): Secondary | ICD-10-CM | POA: Diagnosis not present

## 2020-04-15 DIAGNOSIS — E782 Mixed hyperlipidemia: Secondary | ICD-10-CM | POA: Diagnosis not present

## 2020-04-15 DIAGNOSIS — Z6838 Body mass index (BMI) 38.0-38.9, adult: Secondary | ICD-10-CM | POA: Diagnosis not present

## 2020-04-16 ENCOUNTER — Encounter: Payer: Self-pay | Admitting: Internal Medicine

## 2020-04-16 ENCOUNTER — Other Ambulatory Visit: Payer: Self-pay

## 2020-04-16 ENCOUNTER — Ambulatory Visit (INDEPENDENT_AMBULATORY_CARE_PROVIDER_SITE_OTHER): Payer: Medicare Other | Admitting: Internal Medicine

## 2020-04-16 VITALS — BP 130/76 | HR 83 | Ht 67.0 in | Wt 247.2 lb

## 2020-04-16 DIAGNOSIS — I10 Essential (primary) hypertension: Secondary | ICD-10-CM

## 2020-04-16 DIAGNOSIS — E785 Hyperlipidemia, unspecified: Secondary | ICD-10-CM | POA: Diagnosis not present

## 2020-04-16 DIAGNOSIS — I700 Atherosclerosis of aorta: Secondary | ICD-10-CM

## 2020-04-16 DIAGNOSIS — N184 Chronic kidney disease, stage 4 (severe): Secondary | ICD-10-CM

## 2020-04-16 DIAGNOSIS — N183 Chronic kidney disease, stage 3 unspecified: Secondary | ICD-10-CM

## 2020-04-16 DIAGNOSIS — I129 Hypertensive chronic kidney disease with stage 1 through stage 4 chronic kidney disease, or unspecified chronic kidney disease: Secondary | ICD-10-CM

## 2020-04-16 MED ORDER — ATORVASTATIN CALCIUM 40 MG PO TABS: 40.0000 mg | ORAL_TABLET | Freq: Every day | ORAL | 2 refills | Status: DC

## 2020-04-16 NOTE — Patient Instructions (Signed)
Medication Instructions:   INCREASE YOUR ATORVASTATIN TO 40 MG BY MOUTH DAILY  *If you need a refill on your cardiac medications before your next appointment, please call your pharmacy*   Lab Work:  IN 3-4 MONTHS HERE IN THE OFFICE TO CHECK---LIPIDS AND LFTs.  PLEASE COME FASTING TO THIS LAB APPOINTMENT  If you have labs (blood work) drawn today and your tests are completely normal, you will receive your results only by: Marland Kitchen MyChart Message (if you have MyChart) OR . A paper copy in the mail If you have any lab test that is abnormal or we need to change your treatment, we will call you to review the results.   Follow-Up:  3-4 MONTHS IN THE OFFICE WITH DR. [facility]   Other Instructions  PLEASE START TAKING AND MONITORING YOUR BLOOD PRESSURE DAILY AT HOME.  PLEASE TRACK AND MAKE SURE YOU STAY ON GOAL WITH BP PARAMETERS DR. CHANDRASEKHAR DISCUSSED WITH YOU AT TODAY'S OFFICE VISIT--PLEASE CALL us WITH ANY CONCERNING AND ELEVATED READINGS.

## 2020-04-16 NOTE — Progress Notes (Signed)
Cardiology Office Note:    Date:  04/16/2020   ID:  Lear Ng, DOB 03-Jan-1956, MRN [id number]  PCP:  Deland Pretty, MD  [facility] Cardiologist:  No primary care provider on file.  Previously Saw Dr. Debara Pickett 2014 who Diagnosed Conn's Syndrome [facility] Electrophysiologist:  None   CC: Question of plaque Consulted for the evaluation of Aortic Atherosclerosis at the behest of Deland Pretty, MD  History of Present Illness:    Robin Foley is a 64 y.o. 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  Patient notes that she is feeling ok.  Is fairly sedentary in the setting of prior stroke.  Does not drive.  Does basic ADLs and housework but let's her husband cook.  Has had no chest pain, chest pressure, chest tightness, chest stinging .  No shortness of breath, DOE .  No PND or orthopnea.  No weight gain.  Patient reports prior cardiac testing including 2013 echo and prior elevation in Renin/Aldo level.  Has not done Ambulatory BP .   Past Medical History:  Diagnosis Date  . Chronic kidney disease   . 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 Diabetes mellitus    insulin-dependent   . History of nuclear stress test 07/22/2011   lexiscan; normal pattern of perfusion, low risk   . Hyperlipidemia   . Malignant hypertension   . Memory loss   . Sleep apnea 08/2011   AHI during total sleep 102.40/hr and during REM 0.00/hr (severe sleep apnea  . Stroke [facility]) 2006   right hemiplegia     Past Surgical History:  Procedure Laterality Date  . ABDOMINAL HYSTERECTOMY  1993  . BREAST SURGERY  1975  . TRANSTHORACIC ECHOCARDIOGRAM  07/22/2011   EF=>55%, mild conc LVH; borderline RV enlargement; LA mild-mod dilated; trace MR & TR, RVSP elevated  . UTERINE FIBROID SURGERY  1987    Current  Medications: Current Meds  Medication Sig  . acetaminophen (TYLENOL) 650 MG CR tablet Take 650 mg every 8 (eight) hours as needed by mouth for pain.  Marland Kitchen amLODipine (NORVASC) 10 MG tablet Take 10 mg by mouth daily.  Marland Kitchen aspirin 81 MG tablet Take 81 mg by mouth daily.  Marland Kitchen BAYER CONTOUR TEST test strip   . BAYER MICROLET LANCETS lancets   . Calcium-Magnesium-Vitamin D (CALCIUM 1200+D3 PO) Take by mouth 2 (two) times daily.  . fesoterodine (TOVIAZ) 8 MG TB24 tablet Take 8 mg daily by mouth.  . Insulin Glargine (BASAGLAR KWIKPEN) 100 UNIT/ML SOPN Inject into the skin. 30 units BID  . labetalol (NORMODYNE) 200 MG tablet Take 400 mg by mouth 2 (two) times daily. Take 2 tablets (400mg ) bid  . losartan (COZAAR) 50 MG tablet Take 50 mg by mouth daily.  Marland Kitchen NOVOFINE 32G X 6 MM MISC   . Semaglutide,0.25 or 0.5MG /DOS, 2 MG/1.5ML SOPN Inject 0.5 mg into the skin.  Marland Kitchen spironolactone (ALDACTONE) 25 MG tablet Take 12.5 mg by mouth 2 (two) times daily.  . [DISCONTINUED] atorvastatin (LIPITOR) 20 MG tablet Take 20 mg by mouth daily.     Allergies:   Penicillins   Social History   Socioeconomic History  . Marital status: Married    Spouse name: Not on file  . Number of children: 2  . Years of education: Not on file  . Highest  education level: Not on file  Occupational History  . Not on file  Tobacco Use  . Smoking status: Former Smoker    Packs/day: 1.20    Years: 28.00    Pack years: 33.60    Quit date: 05/11/2004    Years since quitting: 15.9  . Smokeless tobacco: Never Used  Substance and Sexual Activity  . Alcohol use: Not on file  . Drug use: Not on file  . Sexual activity: Not on file  Other Topics Concern  . Not on file  Social History Narrative  . Not on file   Social Determinants of Health   Financial Resource Strain: Not on file  Food Insecurity: Not on file  Transportation Needs: Not on file  Physical Activity: Not on file  Stress: Not on file  Social Connections: Not on file      Family History: The patient's family history includes ALS in her mother; Breast cancer in her sister; Cancer in her maternal grandmother; Diabetes in her mother.  ROS:   Please see the history of present illness.    Notes loss of balance and weakness keep her from doing the things she wants to do. Notes pain with urination, slight hematuria and abdominal tenderness for one day without fevers, chills night sweats All other systems reviewed and are negative.  EKGs/Labs/Other Studies Reviewed:    The following studies were reviewed today:  EKG:  EKG is  ordered today.  The ekg ordered today demonstrates SR rate 80 with low voltage ECG (Precordial) 03/20/2013 SR 71 Nonspecific TWI  Recent Labs: No results found for requested labs within last 8760 hours.  Recent Lipid Panel No results found for: CHOL, TRIG, HDL, CHOLHDL, VLDL, LDLCALC, LDLDIRECT  OSH 10/03/19 Creatinine 2.1 LDL 59 TG 104  07/22/2011 Echo was performed; unable to pull up images  Risk Assessment/Calculations:     N/A  Physical Exam:    VS:  BP 130/76   Pulse 83   Ht 5\' 7"  (1.702 m)   Wt 247 lb 3.2 oz (112.1 kg)   SpO2 96%   BMI 38.72 kg/m     Wt Readings from Last 3 Encounters:  04/16/20 247 lb 3.2 oz (112.1 kg)  01/18/18 250 lb 6.4 oz (113.6 kg)  10/25/17 270 lb (122.5 kg)    GEN: Obese, well developed in no acute distress HEENT: Normal NECK: No JVD; No carotid bruits LYMPHATICS: No lymphadenopathy CARDIAC: RRR, no murmurs, rubs, gallops RESPIRATORY:  Clear to auscultation without rales, wheezing or rhonchi  ABDOMEN: Soft, slightly tender to deep palpation no rebound, non-distended MUSCULOSKELETAL:  No edema; No deformity  SKIN: Warm and dry NEUROLOGIC:  Alert and oriented x 3 PSYCHIATRIC:  Normal affect   ASSESSMENT:    1. Essential hypertension, malignant   2. 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 Chronic kidney disease (CKD), stage IV (severe) ([place])   4.  Morbid obesity ([place])   5. Hyperlipidemia, unspecified hyperlipidemia type   6. Aortic atherosclerosis (HCC)    PLAN:    In order of problems listed above:  Obesity/DM/HTN CKD Stage IV Conn's Syndrome OSA on CPAP - ambulatory blood pressure not sone, will start ambulatory BP monitoring; gave education on how to perform ambulatory blood pressure monitoring including the frequency and technique; goal ambulatory blood pressure < 135/85 on average - continue home medications - we discussed pros and cons of adrenal imaging, but give her surgical risk if we found something will defer at  this time - discussed diet (DASH/low sodium), and exercise/weight loss interventions   Hyperlipidemia Aortic Atherosclerosis Prior Stroke - increase her statin to atorvastatin 40 mg PO Daily -Recheck lipid profile LFTs in 3-4 months - gave education on dietary changes  Discussed virtual eval for UTI vs urgent care for her pain with urination; discussed red flag symptoms prior to PCP call.  4-5 follow up unless new symptoms (CP or SOB) or abnormal test results warranting change in plan  Would be reasonable for  Virtual Follow up Would be reasonable for  APP Follow up   Shared Decision Making/Informed Consent        Medication Adjustments/Labs and Tests Ordered: Current medicines are reviewed at length with the patient today.  Concerns regarding medicines are outlined above.  Orders Placed This Encounter  Procedures  . Lipid Profile  . Hepatic function panel  . EKG 12-Lead   Meds ordered this encounter  Medications  . atorvastatin (LIPITOR) 40 MG tablet    Sig: Take 1 tablet (40 mg total) by mouth daily.    Dispense:  90 tablet    Refill:  2    Dose increase    There are no Patient Instructions on file for this visit.   Signed, Werner Lean, MD  04/16/2020 4:16 PM    [facility]

## 2020-06-04 DIAGNOSIS — E7800 Pure hypercholesterolemia, unspecified: Secondary | ICD-10-CM | POA: Diagnosis not present

## 2020-06-04 DIAGNOSIS — Z6841 Body Mass Index (BMI) 40.0 and over, adult: Secondary | ICD-10-CM | POA: Diagnosis not present

## 2020-06-04 DIAGNOSIS — E1122 Type 2 diabetes mellitus with diabetic chronic kidney disease: Secondary | ICD-10-CM | POA: Diagnosis not present

## 2020-06-04 DIAGNOSIS — I639 Cerebral infarction, unspecified: Secondary | ICD-10-CM | POA: Diagnosis not present

## 2020-06-04 DIAGNOSIS — N183 Chronic kidney disease, stage 3 unspecified: Secondary | ICD-10-CM | POA: Diagnosis not present

## 2020-06-04 DIAGNOSIS — I129 Hypertensive chronic kidney disease with stage 1 through stage 4 chronic kidney disease, or unspecified chronic kidney disease: Secondary | ICD-10-CM | POA: Diagnosis not present

## 2020-06-04 DIAGNOSIS — R32 Unspecified urinary incontinence: Secondary | ICD-10-CM | POA: Diagnosis not present

## 2020-08-12 ENCOUNTER — Encounter: Payer: Self-pay | Admitting: Internal Medicine

## 2020-08-12 ENCOUNTER — Other Ambulatory Visit: Payer: Self-pay

## 2020-08-12 ENCOUNTER — Ambulatory Visit (INDEPENDENT_AMBULATORY_CARE_PROVIDER_SITE_OTHER): Payer: Medicare Other | Admitting: Internal Medicine

## 2020-08-12 ENCOUNTER — Other Ambulatory Visit: Payer: Medicare Other

## 2020-08-12 VITALS — BP 130/68 | HR 84 | Ht 66.0 in | Wt 234.0 lb

## 2020-08-12 DIAGNOSIS — I700 Atherosclerosis of aorta: Secondary | ICD-10-CM | POA: Insufficient documentation

## 2020-08-12 DIAGNOSIS — W19XXXA Unspecified fall, initial encounter: Secondary | ICD-10-CM | POA: Diagnosis not present

## 2020-08-12 DIAGNOSIS — I152 Hypertension secondary to endocrine disorders: Secondary | ICD-10-CM | POA: Diagnosis not present

## 2020-08-12 NOTE — Progress Notes (Signed)
Cardiology Office Note:    Date:  08/12/2020   ID:  Robin Foley, DOB May 11, 1955, MRN [id number]  PCP:  Deland Pretty, MD  [facility] Cardiologist:  Rudean Haskell MD [place] Electrophysiologist:  None   CC: follow up BP   History of Present Illness:    Robin Foley is a 65 y.o. female with a hx of Hyperaldosterone Syndrome s/p OSA on Sleep Apnea, Diabetes with HTN, Aortic Atherosclerosis, prior stroke with residual R hemiplegia, CKD Stage IV, Morbid Obesity who presents for evaluation 04/16/20.  Seen 08/12/20.  Patient notes that she is doing well.  Since last visit notes  changes.  Relevant interval testing or therapy include missed LDL appointment (patient ate).  There are no interval hospital/ED visit, but patient has a mechanical fall when walking up an incline (about three weeks ago)  No chest pain or pressure .  No SOB/DOE and no PND/Orthopnea.  No weight gain or leg swelling.  No palpitations or syncope.  Ambulatory blood pressure not done.   Past Medical History:  Diagnosis Date  . Chronic kidney disease   . 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 Diabetes mellitus    insulin-dependent   . History of nuclear stress test 07/22/2011   lexiscan; normal pattern of perfusion, low risk   . Hyperlipidemia   . Malignant hypertension   . Memory loss   . Sleep apnea 08/2011   AHI during total sleep 102.40/hr and during REM 0.00/hr (severe sleep apnea  . Stroke [facility]) 2006   right hemiplegia     Past Surgical History:  Procedure Laterality Date  . ABDOMINAL HYSTERECTOMY  1993  . BREAST SURGERY  1975  . TRANSTHORACIC ECHOCARDIOGRAM  07/22/2011   EF=>55%, mild conc LVH; borderline RV enlargement; LA mild-mod dilated; trace MR & TR, RVSP elevated  . UTERINE FIBROID SURGERY  1987    Current Medications: Current Meds  Medication Sig  . acetaminophen (TYLENOL) 650 MG CR tablet Take 650 mg every 8 (eight) hours as needed by mouth  for pain.  Marland Kitchen amLODipine (NORVASC) 10 MG tablet Take 10 mg by mouth daily.  Marland Kitchen aspirin 81 MG tablet Take 81 mg by mouth daily.  Marland Kitchen atorvastatin (LIPITOR) 40 MG tablet Take 1 tablet (40 mg total) by mouth daily.  Marland Kitchen BAYER CONTOUR TEST test strip   . BAYER MICROLET LANCETS lancets   . Calcium-Magnesium-Vitamin D (CALCIUM 1200+D3 PO) Take by mouth 2 (two) times daily.  . fesoterodine (TOVIAZ) 8 MG TB24 tablet Take 8 mg daily by mouth.  . Insulin Glargine (BASAGLAR KWIKPEN) 100 UNIT/ML SOPN Inject into the skin. 30 units BID  . labetalol (NORMODYNE) 200 MG tablet Take 400 mg by mouth 2 (two) times daily. Take 2 tablets (400mg ) bid  . losartan (COZAAR) 50 MG tablet Take 50 mg by mouth daily.  Marland Kitchen NOVOFINE 32G X 6 MM MISC   . Semaglutide,0.25 or 0.5MG /DOS, 2 MG/1.5ML SOPN Inject 1 mg into the skin once a week.  . spironolactone (ALDACTONE) 25 MG tablet Take 12.5 mg by mouth 2 (two) times daily.     Allergies:   Penicillins   Social History   Socioeconomic History  . Marital status: Married    Spouse name: Not on file  . Number of children: 2  . Years of education: Not on file  . Highest education level: Not on file  Occupational History  . Not on file  Tobacco Use  . Smoking status: Former  Smoker    Packs/day: 1.20    Years: 28.00    Pack years: 33.60    Quit date: 05/11/2004    Years since quitting: 16.2  . Smokeless tobacco: Never Used  Substance and Sexual Activity  . Alcohol use: Not on file  . Drug use: Not on file  . Sexual activity: Not on file  Other Topics Concern  . Not on file  Social History Narrative  . Not on file   Social Determinants of Health   Financial Resource Strain: Not on file  Food Insecurity: Not on file  Transportation Needs: Not on file  Physical Activity: Not on file  Stress: Not on file  Social Connections: Not on file     Family History: The patient's family history includes ALS in her mother; Breast cancer in her sister; Cancer in her maternal  grandmother; Diabetes in her mother.  ROS:   Please see the history of present illness.    Notes loss of balance and weakness keep her from doing the things she wants to do. Notes pain with urination, slight hematuria and abdominal tenderness for one day without fevers, chills night sweats All other systems reviewed and are negative.  EKGs/Labs/Other Studies Reviewed:    The following studies were reviewed today:  EKG:   04/16/20 SR rate 80 with low voltage ECG (Precordial) 03/20/2013 SR 71 Nonspecific TWI  07/22/2011 Echo was performed; unable to pull up images  Recent Labs: No results found for requested labs within last 8760 hours.  Recent Lipid Panel No results found for: CHOL, TRIG, HDL, CHOLHDL, VLDL, LDLCALC, LDLDIRECT  OSH 10/03/19 Creatinine 2.1 LDL 59 TG 104  Risk Assessment/Calculations:     N/A  Physical Exam:    VS:  BP 130/68   Pulse 84   Ht 5\' 6"  (1.676 m)   Wt 234 lb (106.1 kg)   SpO2 97%   BMI 37.77 kg/m     Wt Readings from Last 3 Encounters:  08/12/20 234 lb (106.1 kg)  04/16/20 247 lb 3.2 oz (112.1 kg)  01/18/18 250 lb 6.4 oz (113.6 kg)    GEN: Obese, well developed in no acute distress HEENT: Normal NECK: No JVD; No carotid bruits LYMPHATICS: No lymphadenopathy CARDIAC: RRR, no murmurs, rubs, gallops; has slight R rib tenderness RESPIRATORY:  Clear to auscultation without rales, wheezing or rhonchi breath sounds in all lung fields ABDOMEN: Soft, slightly tender to deep palpation no rebound, non-distended MUSCULOSKELETAL:  No edema; No deformity  SKIN: Warm and dry NEUROLOGIC:  Alert and oriented x 3 PSYCHIATRIC:  Normal affect   ASSESSMENT:    1. Fall, initial encounter   2. Aortic atherosclerosis (HCC)    PLAN:    In order of problems listed above:  Mechanical Fall -Will get CXR PA and Lateral and reach out to primary MD given the persistence of the pain and to rule out something more serious that simple, uncomplicated rib  fracture  Obesity/DM/HTN CKD Stage IV Conn's Syndrome OSA on CPAP - ambulatory blood pressure not done, will re-start ambulatory BP monitoring; gave education on how to perform ambulatory blood pressure monitoring including the frequency and technique; goal ambulatory blood pressure < 135/85 on average - continue home medications - At initial visit 04/16/20, we discussed pros and cons of adrenal imaging, but give her surgical risk if we found something will defer at this time (this has not changed- but can re-assess in future visits) - discussed diet (DASH/low sodium), and exercise/weight loss interventions  Aortic Atherosclerosis Hyperlipidemia Prior Stroke -Continue statin -Recheck lipid profile & LFTs  - gave education on dietary changes  Six months follow up unless new symptoms or abnormal test results warranting change in plan  Would be reasonable for  APP Follow up      Medication Adjustments/Labs and Tests Ordered: Current medicines are reviewed at length with the patient today.  Concerns regarding medicines are outlined above.  Orders Placed This Encounter  Procedures  . DG Chest 2 View  . Lipid panel  . Hepatic function panel   No orders of the defined types were placed in this encounter.   Patient Instructions  Medication Instructions:  Your physician recommends that you continue on your current medications as directed. Please refer to the Current Medication list given to you today.  *If you need a refill on your cardiac medications before your next appointment, please call your pharmacy*   Lab Work: IN 2-3 WEEKS: Fasting lipid panel and liver function test If you have labs (blood work) drawn today and your tests are completely normal, you will receive your results only by: Marland Kitchen MyChart Message (if you have MyChart) OR . A paper copy in the mail If you have any lab test that is abnormal or we need to change your treatment, we will call you to review the  results.   Testing/Procedures: Your physician has requested that you have a Chest Xray.    Follow-Up: At [facility], you and your health needs are our priority.  As part of our continuing mission to provide you with exceptional heart care, we have created designated Provider Care Teams.  These Care Teams include your primary Cardiologist (physician) and Advanced Practice Providers (APPs -  Physician Assistants and Nurse Practitioners) who all work together to provide you with the care you need, when you need it.  We recommend signing up for the patient portal called "MyChart".  Sign up information is provided on this After Visit Summary.  MyChart is used to connect with patients for Virtual Visits (Telemedicine).  Patients are able to view lab/test results, encounter notes, upcoming appointments, etc.  Non-urgent messages can be sent to your provider as well.   To learn more about what you can do with MyChart, go to [email or web address].    Your next appointment:   6 month(s)  The format for your next appointment:   In Person  Provider:   You may see Barrington Ellison, MD or one of the following Advanced Practice Providers on your designated Care Team:    Melina Copa, PA-C  Ermalinda Barrios, PA-C         Signed, Werner Lean, MD  08/12/2020 11:34 AM    [place]

## 2020-08-12 NOTE — Patient Instructions (Addendum)
Medication Instructions:  Your physician recommends that you continue on your current medications as directed. Please refer to the Current Medication list given to you today.  *If you need a refill on your cardiac medications before your next appointment, please call your pharmacy*   Lab Work: IN 2-3 WEEKS: Fasting lipid panel and liver function test If you have labs (blood work) drawn today and your tests are completely normal, you will receive your results only by: Marland Kitchen MyChart Message (if you have MyChart) OR . A paper copy in the mail If you have any lab test that is abnormal or we need to change your treatment, we will call you to review the results.   Testing/Procedures: Your physician has requested that you have a Chest Xray.    Follow-Up: At [facility], you and your health needs are our priority.  As part of our continuing mission to provide you with exceptional heart care, we have created designated Provider Care Teams.  These Care Teams include your primary Cardiologist (physician) and Advanced Practice Providers (APPs -  Physician Assistants and Nurse Practitioners) who all work together to provide you with the care you need, when you need it.  We recommend signing up for the patient portal called "MyChart".  Sign up information is provided on this After Visit Summary.  MyChart is used to connect with patients for Virtual Visits (Telemedicine).  Patients are able to view lab/test results, encounter notes, upcoming appointments, etc.  Non-urgent messages can be sent to your provider as well.   To learn more about what you can do with MyChart, go to [email or web address].    Your next appointment:   6 month(s)  The format for your next appointment:   In Person  Provider:   You may see Barrington Ellison, MD or one of the following Advanced Practice Providers on your designated Care Team:    Melina Copa, PA-C  Ermalinda Barrios, PA-C

## 2020-09-03 ENCOUNTER — Other Ambulatory Visit: Payer: Medicare Other

## 2020-09-03 ENCOUNTER — Ambulatory Visit
Admission: RE | Admit: 2020-09-03 | Discharge: 2020-09-03 | Disposition: A | Discharge status: Home / Self Care | Payer: Medicare Other | Source: Ambulatory Visit | Attending: Internal Medicine | Admitting: Internal Medicine

## 2020-09-03 ENCOUNTER — Other Ambulatory Visit: Payer: Self-pay

## 2020-09-03 DIAGNOSIS — I700 Atherosclerosis of aorta: Secondary | ICD-10-CM

## 2020-09-03 DIAGNOSIS — W19XXXA Unspecified fall, initial encounter: Secondary | ICD-10-CM

## 2020-09-03 DIAGNOSIS — Z043 Encounter for examination and observation following other accident: Secondary | ICD-10-CM | POA: Diagnosis not present

## 2020-09-03 LAB — HEPATIC FUNCTION PANEL
ALT: 15 IU/L (ref 0–32)
AST: 19 IU/L (ref 0–40)
Albumin: 4 g/dL (ref 3.8–4.8)
Alkaline Phosphatase: 130 IU/L — ABNORMAL HIGH (ref 44–121)
Bilirubin Total: 0.2 mg/dL (ref 0.0–1.2)
Bilirubin, Direct: <0.1 mg/dL (ref 0.00–0.40)
Total Protein: 7.1 g/dL (ref 6.0–8.5)

## 2020-09-03 LAB — LIPID PANEL
Chol/HDL Ratio: 2.9 ratio (ref 0.0–4.4)
Cholesterol, Total: 116 mg/dL (ref 100–199)
HDL: 40 mg/dL (ref >39)
LDL Chol Calc (NIH): 60 mg/dL (ref 0–99)
Triglycerides: 78 mg/dL (ref 0–149)
VLDL Cholesterol Cal: 16 mg/dL (ref 5–40)

## 2020-10-07 DIAGNOSIS — I10 Essential (primary) hypertension: Secondary | ICD-10-CM | POA: Diagnosis not present

## 2020-10-07 DIAGNOSIS — E118 Type 2 diabetes mellitus with unspecified complications: Secondary | ICD-10-CM | POA: Diagnosis not present

## 2020-10-14 DIAGNOSIS — I700 Atherosclerosis of aorta: Secondary | ICD-10-CM | POA: Diagnosis not present

## 2020-10-14 DIAGNOSIS — N1832 Chronic kidney disease, stage 3b: Secondary | ICD-10-CM | POA: Diagnosis not present

## 2020-10-14 DIAGNOSIS — J439 Emphysema, unspecified: Secondary | ICD-10-CM | POA: Diagnosis not present

## 2020-10-14 DIAGNOSIS — G4733 Obstructive sleep apnea (adult) (pediatric): Secondary | ICD-10-CM | POA: Diagnosis not present

## 2020-10-14 DIAGNOSIS — I10 Essential (primary) hypertension: Secondary | ICD-10-CM | POA: Diagnosis not present

## 2020-10-14 DIAGNOSIS — E2601 Conn's syndrome: Secondary | ICD-10-CM | POA: Diagnosis not present

## 2020-10-14 DIAGNOSIS — Z0001 Encounter for general adult medical examination with abnormal findings: Secondary | ICD-10-CM | POA: Diagnosis not present

## 2020-10-14 DIAGNOSIS — I2510 Atherosclerotic heart disease of native coronary artery without angina pectoris: Secondary | ICD-10-CM | POA: Diagnosis not present

## 2020-10-14 DIAGNOSIS — E782 Mixed hyperlipidemia: Secondary | ICD-10-CM | POA: Diagnosis not present

## 2020-10-14 DIAGNOSIS — E1122 Type 2 diabetes mellitus with diabetic chronic kidney disease: Secondary | ICD-10-CM | POA: Diagnosis not present

## 2020-10-14 DIAGNOSIS — D179 Benign lipomatous neoplasm, unspecified: Secondary | ICD-10-CM | POA: Diagnosis not present

## 2020-10-21 DIAGNOSIS — E041 Nontoxic single thyroid nodule: Secondary | ICD-10-CM | POA: Diagnosis not present

## 2020-10-21 DIAGNOSIS — E782 Mixed hyperlipidemia: Secondary | ICD-10-CM | POA: Diagnosis not present

## 2020-10-21 DIAGNOSIS — N184 Chronic kidney disease, stage 4 (severe): Secondary | ICD-10-CM | POA: Diagnosis not present

## 2020-10-21 DIAGNOSIS — G4733 Obstructive sleep apnea (adult) (pediatric): Secondary | ICD-10-CM | POA: Diagnosis not present

## 2020-10-21 DIAGNOSIS — E1122 Type 2 diabetes mellitus with diabetic chronic kidney disease: Secondary | ICD-10-CM | POA: Diagnosis not present

## 2020-10-21 DIAGNOSIS — Z8673 Personal history of transient ischemic attack (TIA), and cerebral infarction without residual deficits: Secondary | ICD-10-CM | POA: Diagnosis not present

## 2020-10-21 DIAGNOSIS — Z6838 Body mass index (BMI) 38.0-38.9, adult: Secondary | ICD-10-CM | POA: Diagnosis not present

## 2020-11-03 ENCOUNTER — Other Ambulatory Visit: Payer: Self-pay

## 2020-11-03 ENCOUNTER — Encounter: Payer: Self-pay | Admitting: Primary Care

## 2020-11-03 ENCOUNTER — Ambulatory Visit (INDEPENDENT_AMBULATORY_CARE_PROVIDER_SITE_OTHER): Payer: Medicare Other | Admitting: Primary Care

## 2020-11-03 VITALS — BP 116/60 | HR 75 | Temp 98.6°F | Ht 67.0 in | Wt 237.4 lb

## 2020-11-03 DIAGNOSIS — G4733 Obstructive sleep apnea (adult) (pediatric): Secondary | ICD-10-CM

## 2020-11-03 NOTE — Progress Notes (Signed)
_0  ID: Robin Foley, female    DOB: 1955/11/04, 66 y.o.   MRN: [id number]  No chief complaint on file.   Referring provider: Deland Pretty, MD  HPI: 65 year old female, former smoker quit in 2006 (33-pack-year history).  Past medical history significant for OSA, hypertension, stroke, Conn's syndrome, type 1 diabetes, chronic kidney disease stage III, morbid obesity.  Patient of Dr. Annamaria Boots  - NPSG 08/09/2011 severe obstructive sleep apnea, AHI 102/hr with SPO2 low 84%   - CPAP and adequate and titrated to BiPAP 20/16 with residual apneas and desaturation. Body weight 277 pounds.   - Home sleep study 04/12/2017, AHI 60.4 an hour with O2 low 54%, body weight 272 pounds.   - CPAP titration study 10/26/1915 [facility], min sat 90%  11/03/2020- Interim hx  Patient presents today for overdue follow-up, she was last seen in September 2019. She was on CPAP at 17cm h20, may need to change to BIPAP if unable to tolerate d/t higher pressure. She has not used CPAP in over 2 years. She had trouble with water chamber and was unsure if she could use as one of your tubes got wet. There is a little bit of a learning barrier d/t mild cognitive impairment, she lives with her husband. She is open to resuming PAP use. She has lost 30-40 lbs since last sleep study.  She goes to bed at midnight and get out of bed to start her day at 12pm. She wakes up frequently at night, states that it is not a restful sleep.    Imaging: Lung Cancer Screening CT chest program CT chest 8/113/19-  Lung-RADS 2, benign appearance or behavior. Continue annual screening with low-dose chest CT without contrast in 12 months. Aortic Atherosclerosis (ICD10-I70.0).   Allergies  Allergen Reactions   Penicillins Hives    Immunization History  Administered Date(s) Administered   Influenza,inj,Quad PF,6+ Mos 01/13/2017   Influenza-Unspecified 01/07/2018    Past Medical History:  Diagnosis Date   Chronic kidney disease    Conn  syndrome ([place])    elevated plasma-renin-aldosterone ratio   Diabetes mellitus    insulin-dependent    History of nuclear stress test 07/22/2011   lexiscan; normal pattern of perfusion, low risk    Hyperlipidemia    Malignant hypertension    Memory loss    Sleep apnea 08/2011   AHI during total sleep 102.40/hr and during REM 0.00/hr (severe sleep apnea   Stroke [facility]) 2006   right hemiplegia     Tobacco History: Social History   Tobacco Use  Smoking Status Former   Packs/day: 1.20   Years: 28.00   Pack years: 33.60   Types: Cigarettes   Quit date: 05/11/2004   Years since quitting: 16.4  Smokeless Tobacco Never   Counseling given: Not Answered   Outpatient Medications Prior to Visit  Medication Sig Dispense Refill   acetaminophen (TYLENOL) 650 MG CR tablet Take 650 mg every 8 (eight) hours as needed by mouth for pain.     amLODipine (NORVASC) 10 MG tablet Take 10 mg by mouth daily.     aspirin 81 MG tablet Take 81 mg by mouth daily.     atorvastatin (LIPITOR) 40 MG tablet Take 1 tablet (40 mg total) by mouth daily. 90 tablet 2   BAYER CONTOUR TEST test strip      BAYER MICROLET LANCETS lancets      Calcium-Magnesium-Vitamin D (CALCIUM 1200+D3 PO) Take by mouth 2 (two) times daily.     fesoterodine (  TOVIAZ) 8 MG TB24 tablet Take 8 mg daily by mouth.     Insulin Glargine (BASAGLAR KWIKPEN) 100 UNIT/ML SOPN Inject into the skin. 30 units BID     labetalol (NORMODYNE) 200 MG tablet Take 400 mg by mouth 2 (two) times daily. Take 2 tablets (400mg ) bid     losartan (COZAAR) 50 MG tablet Take 50 mg by mouth daily.     NOVOFINE 32G X 6 MM MISC      Semaglutide,0.25 or 0.5MG /DOS, 2 MG/1.5ML SOPN Inject 1 mg into the skin once a week.     spironolactone (ALDACTONE) 25 MG tablet Take 12.5 mg by mouth 2 (two) times daily.     No facility-administered medications prior to visit.    Review of Systems  Review of Systems  Constitutional:  Negative for fatigue.  Respiratory:  Negative  for cough, shortness of breath and wheezing.   Cardiovascular: Negative.   Psychiatric/Behavioral:  Positive for sleep disturbance.     Physical Exam  BP 116/60 (BP Location: Left Arm, Patient Position: Sitting, Cuff Size: Normal)   Pulse 75   Temp 98.6 F (37 C) (Oral)   Ht 5\' 7"  (1.702 m)   Wt 237 lb 6.4 oz (107.7 kg)   SpO2 100%   BMI 37.18 kg/m  Physical Exam Constitutional:      General: She is not in acute distress.    Appearance: Normal appearance. She is obese. She is not ill-appearing.  Cardiovascular:     Rate and Rhythm: Normal rate and regular rhythm.  Pulmonary:     Effort: Pulmonary effort is normal.     Breath sounds: Normal breath sounds.  Skin:    General: Skin is warm and dry.  Neurological:     General: No focal deficit present.     Mental Status: She is alert and oriented to person, place, and time. Mental status is at baseline.  Psychiatric:        Mood and Affect: Mood normal.        Behavior: Behavior normal.        Thought Content: Thought content normal.        Judgment: Judgment normal.     Lab Results:  CBC    Component Value Date/Time   WBC 7.1 01/17/2007 1100   RBC 4.05 01/17/2007 1100   HGB 10.4 (L) 01/17/2007 1100   HCT 32.2 (L) 01/17/2007 1100   PLT 224 01/17/2007 1100   MCV 79.3 01/17/2007 1100   MCHC 32.2 01/17/2007 1100   RDW 15.8 (H) 01/17/2007 1100   LYMPHSABS 1.2 01/17/2007 1100   MONOABS 0.4 01/17/2007 1100   EOSABS 0.2 01/17/2007 1100   BASOSABS 0.0 01/17/2007 1100    BMET    Component Value Date/Time   NA 141 03/20/2013 1041   K 4.2 03/20/2013 1041   CL 104 03/20/2013 1041   CO2 28 03/20/2013 1041   GLUCOSE 66 (L) 03/20/2013 1041   BUN 17 03/20/2013 1041   CREATININE 1.83 (H) 03/20/2013 1041   CALCIUM 9.3 03/20/2013 1041   GFRNONAA >60 01/17/2007 1100   GFRAA  01/17/2007 1100    >60        The eGFR has been calculated using the MDRD equation. This calculation has not been validated in all clinical     BNP No results found for: BNP  ProBNP No results found for: PROBNP  Imaging: No results found.   Assessment & Plan:   OSA (obstructive sleep apnea) - Hx severe OSA,  she has not worn CPAP in > 2 years. Most recent setting was 17cm h20. She has lost 30-40lbs since last sleep study. She reports disrupted sleep.  - Needs in-lab split night sleep study. We will call her after testing to review results  - FU in 3 months in office with Dr. Kaleen Mask, NP 11/03/2020

## 2020-11-03 NOTE — Assessment & Plan Note (Signed)
-   Hx severe OSA, she has not worn CPAP in > 2 years. Most recent setting was 17cm h20. She has lost 30-40lbs since last sleep study. She reports disrupted sleep.  - Needs in-lab split night sleep study. We will call her after testing to review results  - FU in 3 months in office with Dr. Annamaria Boots

## 2020-11-03 NOTE — Patient Instructions (Signed)
Nice to see you today, don't go so long without seeing [place] again!!!  Recommendations: We need to repeat sleep study before we can have you resume CPAP or BIPAP therapy   Orders: Split night sleep study  Follow-up: 3 months with Dr. Annamaria Boots or sooner if needed   CPAP and BPAP Information CPAP and BPAP (also called BiPAP) are methods that use air pressure to keep your airways open and to help you breathe well. CPAP and BPAP use different amounts of pressure. Your health care provider will tell you whether CPAP or BPAP would be more helpful for you. CPAP stands for "continuous positive airway pressure." With CPAP, the amount of pressure stays the same while you breathe in (inhale) and out (exhale). BPAP stands for "bi-level positive airway pressure." With BPAP, the amount of pressure will be higher when you inhale and lower when you exhale. This allows you to take larger breaths. CPAP or BPAP may be used in the hospital, or your health care provider may want you to use it at home. You may need to have a sleep study before your healthcare provider can order a machine for you to use at home. What are the advantages? CPAP or BPAP can be helpful if you have: Sleep apnea. Chronic obstructive pulmonary disease (COPD). Heart failure. Medical conditions that cause muscle weakness, including muscular dystrophy or amyotrophic lateral sclerosis (ALS). Other problems that cause breathing to be shallow, weak, abnormal, or difficult. CPAP and BPAP are most commonly used for obstructive sleep apnea (OSA) to keepthe airways from collapsing when the muscles relax during sleep. What are the risks? Generally, this is a safe treatment. However, problems may occur, including: Irritated skin or skin sores if the mask does not fit properly. Dry or stuffy nose or nosebleeds. Dry mouth. Feeling gassy or bloated. Sinus or lung infection if the equipment is not cleaned properly. When should CPAP or BPAP be used? In  most cases, the mask only needs to be worn during sleep. Generally, the mask needs to be worn throughout the night and during any daytime naps. People with certain medical conditions may also need to wear the mask at other times, such as when they are awake. Follow instructions from your health care providerabout when to use the machine. What happens during CPAP or BPAP?  Both CPAP and BPAP are provided by a small machine with a flexible plastic tube that attaches to a plastic mask that you wear. Air is blown through the mask into your nose or mouth. The amount of pressure that is used to blow the air can be adjusted on the machine. Your health care provider will set the pressuresetting and help you find the best mask for you. Tips for using the mask Because the mask needs to be snug, some people feel trapped or closed-in (claustrophobic) when first using the mask. If you feel this way, you may need to get used to the mask. One way to do this is to hold the mask loosely over your nose or mouth and then gradually apply the mask more snugly. You can also gradually increase the amount of time that you use the mask. Masks are available in various types and sizes. If your mask does not fit well, talk with your health care provider about getting a different one. Some common types of masks include: Full face masks, which fit over the mouth and nose. Nasal masks, which fit over the nose. Nasal pillow or prong masks, which fit into  the nostrils. If you are using a mask that fits over your nose and you tend to breathe through your mouth, a chin strap may be applied to help keep your mouth closed. Use a skin barrier to protect your skin as told by your health care provider. Some CPAP and BPAP machines have alarms that may sound if the mask comes off or develops a leak. If you have trouble with the mask, it is very important that you talk with your health care provider about finding a way to make the mask easier to  tolerate. Do not stop using the mask. There could be a negative impact on your health if you stop using the mask. Tips for using the machine Place your CPAP or BPAP machine on a secure table or stand near an electrical outlet. Know where the on/off switch is on the machine. Follow instructions from your health care provider about how to set the pressure on your machine and when you should use it. Do not eat or drink while the CPAP or BPAP machine is on. Food or fluids could get pushed into your lungs by the pressure of the CPAP or BPAP. For home use, CPAP and BPAP machines can be rented or purchased through home health care companies. Many different brands of machines are available. Renting a machine before purchasing may help you find out which particular machine works well for you. Your health insurance company may also decide which machine you may get. Keep the CPAP or BPAP machine and attachments clean. Ask your health care provider for specific instructions. Check the humidifier if you have a dry stuffy nose or nosebleeds. Make sure it is working correctly. Follow these instructions at home: Take over-the-counter and prescription medicines only as told by your health care provider. Ask if you can take sinus medicine if your sinuses are blocked. Do not use any products that contain nicotine or tobacco. These products include cigarettes, chewing tobacco, and vaping devices, such as e-cigarettes. If you need help quitting, ask your health care provider. Keep all follow-up visits. This is important. Contact a health care provider if: You have redness or pressure sores on your head, face, mouth, or nose from the mask or head gear. You have trouble using the CPAP or BPAP machine. You cannot tolerate wearing the CPAP or BPAP mask. Someone tells you that you snore even when wearing your CPAP or BPAP. Get help right away if: You have trouble breathing. You feel confused. Summary CPAP and BPAP are  methods that use air pressure to keep your airways open and to help you breathe well. If you have trouble with the mask, it is very important that you talk with your health care provider about finding a way to make the mask easier to tolerate. Do not stop using the mask. There could be a negative impact to your health if you stop using the mask. Follow instructions from your health care provider about when to use the machine. This information is not intended to replace advice given to you by your health care provider. Make sure you discuss any questions you have with your healthcare provider. Document Revised: 04/03/2020 Document Reviewed: 04/03/2020 Elsevier Patient Education  2022 [employer].

## 2020-11-23 DIAGNOSIS — E041 Nontoxic single thyroid nodule: Secondary | ICD-10-CM | POA: Diagnosis not present

## 2020-12-12 ENCOUNTER — Other Ambulatory Visit: Payer: Self-pay | Admitting: Internal Medicine

## 2020-12-12 DIAGNOSIS — N184 Chronic kidney disease, stage 4 (severe): Secondary | ICD-10-CM

## 2020-12-12 DIAGNOSIS — I10 Essential (primary) hypertension: Secondary | ICD-10-CM

## 2020-12-12 DIAGNOSIS — I129 Hypertensive chronic kidney disease with stage 1 through stage 4 chronic kidney disease, or unspecified chronic kidney disease: Secondary | ICD-10-CM

## 2020-12-12 DIAGNOSIS — E785 Hyperlipidemia, unspecified: Secondary | ICD-10-CM

## 2020-12-12 DIAGNOSIS — I700 Atherosclerosis of aorta: Secondary | ICD-10-CM

## 2020-12-12 DIAGNOSIS — N183 Chronic kidney disease, stage 3 unspecified: Secondary | ICD-10-CM

## 2021-01-13 ENCOUNTER — Ambulatory Visit (HOSPITAL_BASED_OUTPATIENT_CLINIC_OR_DEPARTMENT_OTHER): Payer: Medicare Other | Attending: Primary Care | Admitting: Pulmonary Disease

## 2021-01-13 ENCOUNTER — Other Ambulatory Visit: Payer: Self-pay

## 2021-01-13 DIAGNOSIS — G4733 Obstructive sleep apnea (adult) (pediatric): Secondary | ICD-10-CM | POA: Diagnosis not present

## 2021-01-14 DIAGNOSIS — G4733 Obstructive sleep apnea (adult) (pediatric): Secondary | ICD-10-CM

## 2021-01-14 NOTE — Procedures (Signed)
Patient Name: Robin Foley, Robin Foley Date: 01/13/2021 Gender: Female D.O.B: 03-Dec-1955 Age (years): 48 Referring Provider: Kara Mead MD, ABSM Height (inches): 66 Interpreting Physician: Kara Mead MD, ABSM Weight (lbs): 240 RPSGT: Carolin Coy BMI: 39 MRN: [id number] Neck Size: 15.00 <br> <br>  CLINICAL INFORMATION The patient is referred for a split night study with BPAP. Most recent polysomnogram dated 04/22/2017 revealed an AHI of 60.4/h. Most recent titration study dated 10/25/2017 was optimal at 17cm H2O with an AHI of 36.6/h. MEDICATIONS Medications self-administered by patient taken the night of the study : SPIRONOLACTONE, LABETALOL HCL, ATORVASTATIN, BABY ASPIRIN, VITAMIN D3, ASPIRIN, LABETALOL  SLEEP STUDY TECHNIQUE As per the AASM Manual for the Scoring of Sleep and Associated Events v2.3 (April 2016) with a hypopnea requiring 4% desaturations.  The channels recorded and monitored were frontal, central and occipital EEG, electrooculogram (EOG), submentalis EMG (chin), nasal and oral airflow, thoracic and abdominal wall motion, anterior tibialis EMG, snore microphone, electrocardiogram, and pulse oximetry. Bi-level positive airway pressure (BiPAP) was initiated when the patient met split night criteria and was titrated according to treat sleep-disordered breathing.  RESPIRATORY PARAMETERS Diagnostic  Total AHI (/hr): 124.6 RDI (/hr): 125.1 OA Index (/hr): 42.6 CA Index (/hr): 0.0 REM AHI (/hr): N/A NREM AHI (/hr): 124.6 Supine AHI (/hr): N/A Non-supine AHI (/hr): 125.09 Min O2 Sat (%): 80.0 Mean O2 (%): 90.4 Time below 88% (min): 47.3   Titration  Optimal IPAP Pressure (cm): 25 Optimal EPAP Pressure (cm): 21 AHI at Optimal Pressure (/hr): 0 Min O2 at Optimal Pressure (%): 93.0 Sleep % at Optimal (%): 100 Supine % at Optimal (%): 0     SLEEP ARCHITECTURE The study was initiated at 9:58:52 PM and terminated at 5:36:58 AM. The total recorded time was 458.1 minutes.  EEG confirmed total sleep time was 251.9 minutes yielding a sleep efficiency of 55.0%%. Sleep onset after lights out was 30.7 minutes with a REM latency of 334.5 minutes. The patient spent 28.2%% of the night in stage N1 sleep, 66.3%% in stage N2 sleep, 0.0%% in stage N3 and 5.6% in REM. Wake after sleep onset (WASO) was 175.5 minutes. The Arousal Index was 44.1/hour.  LEG MOVEMENT DATA The total Periodic Limb Movements of Sleep (PLMS) were 0. The PLMS index was 0.0 .  CARDIAC DATA The 2 lead EKG demonstrated sinus rhythm. The mean heart rate was 100.0 beats per minute. Other EKG findings include: None.   IMPRESSIONS - Severe obstructive sleep apnea occurred during the diagnostic portion of the study (AHI = 124.6 /hour). An optimal PAP pressure was selected for this patient ( 25 / 21 cm of water) - No significant central sleep apnea occurred during the diagnostic portion of the study (CAI = 0.0/hour). - Severe oxygen desaturation was noted during the diagnostic portion of the study (Min O2 = 80.0%). - The patient snored with loud snoring volume during the diagnostic portion of the study. - No cardiac abnormalities were noted during this study. - Clinically significant periodic limb movements of sleep did not occur during the study.   DIAGNOSIS - Obstructive Sleep Apnea (G47.33)   RECOMMENDATIONS - Trial of BiPAP therapy on 25/21 cm H2O with a Medium size Fisher&Paykel Full Face Mask Simplus mask and heated humidification. - Avoid alcohol, sedatives and other CNS depressants that may worsen sleep apnea and disrupt normal sleep architecture. - Sleep hygiene should be reviewed to assess factors that may improve sleep quality. - Weight management and regular exercise should be initiated or continued. - Return  to [facility] for re-evaluation.   Kara Mead MD Board Certified in [place]

## 2021-01-15 NOTE — Progress Notes (Signed)
Please let patient know sleep study showed severe OSA, AHI 124/hr. Needs order placed for BIPAP therapy 25/21 h20 with medium size F&P full face mask simplus mask and heated humidification   She has a CPAP but needs BIPAP machine, please place order as high priority. Needs fu in 31-90 days with Dr. Annamaria Boots

## 2021-01-29 ENCOUNTER — Other Ambulatory Visit: Payer: Self-pay | Admitting: Primary Care

## 2021-01-29 DIAGNOSIS — G4733 Obstructive sleep apnea (adult) (pediatric): Secondary | ICD-10-CM

## 2021-01-29 NOTE — Progress Notes (Signed)
I left a message for the patient to call back about the results from the sleep study. Office number given.

## 2021-01-29 NOTE — Progress Notes (Signed)
Order for Bipap for this patient per Geraldo Pitter, NP. Placed as urgent.

## 2021-01-29 NOTE — Addendum Note (Signed)
Addended by: Dessie Coma on: 01/29/2021 02:55 PM   Modules accepted: Orders

## 2021-02-02 ENCOUNTER — Encounter: Payer: Self-pay | Admitting: Primary Care

## 2021-02-02 ENCOUNTER — Ambulatory Visit (INDEPENDENT_AMBULATORY_CARE_PROVIDER_SITE_OTHER): Payer: Medicare Other | Admitting: Primary Care

## 2021-02-02 ENCOUNTER — Other Ambulatory Visit: Payer: Self-pay

## 2021-02-02 DIAGNOSIS — G4733 Obstructive sleep apnea (adult) (pediatric): Secondary | ICD-10-CM

## 2021-02-02 NOTE — Progress Notes (Signed)
_0  ID: Robin Foley, female    DOB: 03-29-1956, 65 y.o.   MRN: [id number]  Chief Complaint  Patient presents with   Follow-up    Patient reports that she is here to go over sleep results.    Referring provider: Deland Pretty, MD  HPI: 65 year old female, former smoker quit in 2006 (33-pack-year history).  Past medical history significant for OSA, hypertension, stroke, Conn's syndrome, type 1 diabetes, chronic kidney disease stage III, morbid obesity.  Patient of Dr. Annamaria Boots  - NPSG 08/09/2011 severe obstructive sleep apnea, AHI 102/hr with SPO2 low 84%   - CPAP and adequate and titrated to BiPAP 20/16 with residual apneas and desaturation. Body weight 277 pounds.   - Home sleep study 04/12/2017, AHI 60.4 an hour with O2 low 54%, body weight 272 pounds.   - CPAP titration study 10/26/1915 [facility], min sat 90%  Previous [facility] pulmonary encounter: 11/03/2020 Patient presents today for overdue follow-up, she was last seen in September 2019. She was on CPAP at 17cm h20, may need to change to BIPAP if unable to tolerate d/t higher pressure. She has not used CPAP in over 2 years. She had trouble with water chamber and was unsure if she could use as one of your tubes got wet. There is a little bit of a learning barrier d/t mild cognitive impairment, she lives with her husband. She is open to resuming PAP use. She has lost 30-40 lbs since last sleep study.  She goes to bed at midnight and get out of bed to start her day at 12pm. She wakes up frequently at night, states that it is not a restful sleep.   02/02/2021  Interim hx  Patient presents today for follow-up. She experiences daytime fatigue, she has been off CPAP for several years. Sleep study showed severe OSA, AHI 124.6/hr. Optimal pressure 25/21cm h20 with medium size Fisher[employer]kel full faced mask with heated humidification. We reviewed sleep study results and discussed treatment options, recommend she be started on BIPAP d.t severity of her sleep  apnea. Optimal pressure 25/21cm h20. Patient agreeing with treatment plan.   Imaging: Lung Cancer Screening CT chest program CT chest 8/113/19-  Lung-RADS 2, benign appearance or behavior. Continue annual screening with low-dose chest CT without contrast in 12 months. Aortic Atherosclerosis (ICD10-I70.0).  Allergies  Allergen Reactions   Penicillins Hives    Immunization History  Administered Date(s) Administered   Influenza,inj,Quad PF,6+ Mos 01/13/2017   Influenza-Unspecified 01/07/2018    Past Medical History:  Diagnosis Date   Chronic kidney disease    Conn syndrome ([place])    elevated plasma-renin-aldosterone ratio   Diabetes mellitus    insulin-dependent    History of nuclear stress test 07/22/2011   lexiscan; normal pattern of perfusion, low risk    Hyperlipidemia    Malignant hypertension    Memory loss    Sleep apnea 08/2011   AHI during total sleep 102.40/hr and during REM 0.00/hr (severe sleep apnea   Stroke [facility]) 2006   right hemiplegia     Tobacco History: Social History   Tobacco Use  Smoking Status Former   Packs/day: 1.20   Years: 28.00   Pack years: 33.60   Types: Cigarettes   Quit date: 05/11/2004   Years since quitting: 16.7  Smokeless Tobacco Never   Counseling given: Not Answered   Outpatient Medications Prior to Visit  Medication Sig Dispense Refill   acetaminophen (TYLENOL) 650 MG CR tablet Take 650 mg every 8 (eight) hours  as needed by mouth for pain.     amLODipine (NORVASC) 10 MG tablet Take 10 mg by mouth daily.     aspirin 81 MG tablet Take 81 mg by mouth daily.     atorvastatin (LIPITOR) 40 MG tablet TAKE 1 TABLET BY MOUTH EVERY DAY 90 tablet 2   BAYER CONTOUR TEST test strip      BAYER MICROLET LANCETS lancets      Calcium-Magnesium-Vitamin D (CALCIUM 1200+D3 PO) Take by mouth 2 (two) times daily.     fesoterodine (TOVIAZ) 8 MG TB24 tablet Take 8 mg daily by mouth.     Insulin Glargine (BASAGLAR KWIKPEN) 100 UNIT/ML SOPN Inject  into the skin. 30 units BID     labetalol (NORMODYNE) 200 MG tablet Take 400 mg by mouth 2 (two) times daily. Take 2 tablets (436m) bid     losartan (COZAAR) 50 MG tablet Take 50 mg by mouth daily.     NOVOFINE 32G X 6 MM MISC      Semaglutide,0.25 or 0.5MG/DOS, 2 MG/1.5ML SOPN Inject 1 mg into the skin once a week.     spironolactone (ALDACTONE) 25 MG tablet Take 12.5 mg by mouth 2 (two) times daily.     TRULICITY 4.5 MSV/7.7LTSOPN Inject into the skin once a week.     No facility-administered medications prior to visit.    Review of Systems  Review of Systems  Constitutional:  Positive for fatigue.  HENT: Negative.    Respiratory: Negative.    Psychiatric/Behavioral:  Positive for sleep disturbance.     Physical Exam  BP 110/64 (BP Location: Left Arm, Patient Position: Sitting, Cuff Size: Large)   Pulse 84   Temp 98.3 F (36.8 C) (Oral)   Ht _0  (1.676 m)   Wt 235 lb (106.6 kg)   SpO2 97%   BMI 37.93 kg/m  Physical Exam Constitutional:      General: She is not in acute distress.    Appearance: Normal appearance. She is obese. She is not ill-appearing.  HENT:     Head: Normocephalic and atraumatic.  Cardiovascular:     Rate and Rhythm: Normal rate and regular rhythm.  Pulmonary:     Effort: Pulmonary effort is normal.     Breath sounds: Normal breath sounds.  Musculoskeletal:     Comments: Amb with rolling walker  Neurological:     General: No focal deficit present.     Mental Status: She is alert and oriented to person, place, and time. Mental status is at baseline.  Psychiatric:        Mood and Affect: Mood normal.        Behavior: Behavior normal.        Thought Content: Thought content normal.        Judgment: Judgment normal.     Lab Results:  CBC    Component Value Date/Time   WBC 7.1 01/17/2007 1100   RBC 4.05 01/17/2007 1100   HGB 10.4 (L) 01/17/2007 1100   HCT 32.2 (L) 01/17/2007 1100   PLT 224 01/17/2007 1100   MCV 79.3 01/17/2007 1100    MCHC 32.2 01/17/2007 1100   RDW 15.8 (H) 01/17/2007 1100   LYMPHSABS 1.2 01/17/2007 1100   MONOABS 0.4 01/17/2007 1100   EOSABS 0.2 01/17/2007 1100   BASOSABS 0.0 01/17/2007 1100    BMET    Component Value Date/Time   NA 141 03/20/2013 1041   K 4.2 03/20/2013 1041   CL 104 03/20/2013 1041  CO2 28 03/20/2013 1041   GLUCOSE 66 (L) 03/20/2013 1041   BUN 17 03/20/2013 1041   CREATININE 1.83 (H) 03/20/2013 1041   CALCIUM 9.3 03/20/2013 1041   GFRNONAA >60 01/17/2007 1100   GFRAA  01/17/2007 1100    >60        The eGFR has been calculated using the MDRD equation. This calculation has not been validated in all clinical    BNP No results found for: BNP  ProBNP No results found for: PROBNP  Imaging: Split night study  Result Date: 01/13/2021 Rigoberto Noel, MD     01/14/2021  1:24 PM Patient Name: Shirlean Schlein Date: 01/13/2021 Gender: Female D.O.B: [birth year] Age (years): 36 Referring Provider: Kara Mead MD, ABSM Height (inches): 66 Interpreting Physician: Kara Mead MD, ABSM Weight (lbs): 240 RPSGT: Carolin Coy BMI: 39 MRN: [id number] Neck Size: 15.00 <br> <br> CLINICAL INFORMATION The patient is referred for a split night study with BPAP. Most recent polysomnogram dated 04/22/2017 revealed an AHI of 60.4/h. Most recent titration study dated 10/25/2017 was optimal at 17cm H2O with an AHI of 36.6/h. MEDICATIONS Medications self-administered by patient taken the night of the study : SPIRONOLACTONE, LABETALOL HCL, ATORVASTATIN, BABY ASPIRIN, VITAMIN D3, ASPIRIN, LABETALOL SLEEP STUDY TECHNIQUE As per the AASM Manual for the Scoring of Sleep and Associated Events v2.3 (April 2016) with a hypopnea requiring 4% desaturations. The channels recorded and monitored were frontal, central and occipital EEG, electrooculogram (EOG), submentalis EMG (chin), nasal and oral airflow, thoracic and abdominal wall motion, anterior tibialis EMG, snore microphone, electrocardiogram, and pulse  oximetry. Bi-level positive airway pressure (BiPAP) was initiated when the patient met split night criteria and was titrated according to treat sleep-disordered breathing. RESPIRATORY PARAMETERS Diagnostic Total AHI (/hr): 124.6 RDI (/hr): 125.1 OA Index (/hr): 42.6 CA Index (/hr): 0.0 REM AHI (/hr): N/A NREM AHI (/hr): 124.6 Supine AHI (/hr): N/A Non-supine AHI (/hr): 125.09 Min O2 Sat (%): 80.0 Mean O2 (%): 90.4 Time below 88% (min): 47.3 Titration Optimal IPAP Pressure (cm): 25 Optimal EPAP Pressure (cm): 21 AHI at Optimal Pressure (/hr): 0 Min O2 at Optimal Pressure (%): 93.0 Sleep % at Optimal (%): 100 Supine % at Optimal (%): 0 SLEEP ARCHITECTURE The study was initiated at 9:58:52 PM and terminated at 5:36:58 AM. The total recorded time was 458.1 minutes. EEG confirmed total sleep time was 251.9 minutes yielding a sleep efficiency of 55.0%%. Sleep onset after lights out was 30.7 minutes with a REM latency of 334.5 minutes. The patient spent 28.2%% of the night in stage N1 sleep, 66.3%% in stage N2 sleep, 0.0%% in stage N3 and 5.6% in REM. Wake after sleep onset (WASO) was 175.5 minutes. The Arousal Index was 44.1/hour. LEG MOVEMENT DATA The total Periodic Limb Movements of Sleep (PLMS) were 0. The PLMS index was 0.0 . CARDIAC DATA The 2 lead EKG demonstrated sinus rhythm. The mean heart rate was 100.0 beats per minute. Other EKG findings include: None. IMPRESSIONS - Severe obstructive sleep apnea occurred during the diagnostic portion of the study (AHI = 124.6 /hour). An optimal PAP pressure was selected for this patient ( 25 / 21 cm of water) - No significant central sleep apnea occurred during the diagnostic portion of the study (CAI = 0.0/hour). - Severe oxygen desaturation was noted during the diagnostic portion of the study (Min O2 = 80.0%). - The patient snored with loud snoring volume during the diagnostic portion of the study. - No cardiac abnormalities were noted during this study. -  Clinically  significant periodic limb movements of sleep did not occur during the study. DIAGNOSIS - Obstructive Sleep Apnea (G47.33) RECOMMENDATIONS - Trial of BiPAP therapy on 25/21 cm H2O with a Medium size Fisher&Paykel Full Face Mask Simplus mask and heated humidification. - Avoid alcohol, sedatives and other CNS depressants that may worsen sleep apnea and disrupt normal sleep architecture. - Sleep hygiene should be reviewed to assess factors that may improve sleep quality. - Weight management and regular exercise should be initiated or continued. - Return to [facility] for re-evaluation. Kara Mead MD Board Certified in [place]  Result Date: 01/15/2021 Ordered by an unspecified provider.    Assessment & Plan:   OSA (obstructive sleep apnea) - Split night sleep study on 01/13/21 showed patient has severe obstructive sleep apnea, AHI 124.6/hr. We have already placed an urgent order for BIPAP machine at pressure setting 25/21cm h20. She did not require supplemental oxygen. Advised patient once she receives BIPAP to start wearing mask every night 4-6 hours or longer. She should not drive if experiencing excessive daytime fatigue or somnolence. Follow-up in 3-4 months with Dr. Annamaria Boots or sooner if needed.    Martyn Ehrich, NP 02/05/2021

## 2021-02-02 NOTE — Patient Instructions (Addendum)
Split night sleep study showed that you have severe obstructive sleep apnea. We have already placed an order for urgent referral BIPAP machine on 9/23, it may take a couple of weeks to get a new machine. If you have not heard anything by November please call our office   Recommendations: Once you get BIPAP machine start wearing mask every night for 4-6 hours or longer  Do not drive if experiencing excessive daytime sleepiness If you have any issues with BIPAP contact our office   Follow-up: 3-4 months with Dr. Annamaria Boots or sooner if needed    CPAP and BPAP Information CPAP and BPAP (also called BiPAP) are methods that use air pressure to keep your airways open and to help you breathe well. CPAP and BPAP use different amounts of pressure. Your health care provider will tell you whether CPAP or BPAP would be more helpful for you. CPAP stands for "continuous positive airway pressure." With CPAP, the amount of pressure stays the same while you breathe in (inhale) and out (exhale). BPAP stands for "bi-level positive airway pressure." With BPAP, the amount of pressure will be higher when you inhale and lower when you exhale. This allows you to take larger breaths. CPAP or BPAP may be used in the hospital, or your health care provider may want you to use it at home. You may need to have a sleep study before your health care provider can order a machine for you to use at home. What are the advantages? CPAP or BPAP can be helpful if you have: Sleep apnea. Chronic obstructive pulmonary disease (COPD). Heart failure. Medical conditions that cause muscle weakness, including muscular dystrophy or amyotrophic lateral sclerosis (ALS). Other problems that cause breathing to be shallow, weak, abnormal, or difficult. CPAP and BPAP are most commonly used for obstructive sleep apnea (OSA) to keep the airways from collapsing when the muscles relax during sleep. What are the risks? Generally, this is a safe  treatment. However, problems may occur, including: Irritated skin or skin sores if the mask does not fit properly. Dry or stuffy nose or nosebleeds. Dry mouth. Feeling gassy or bloated. Sinus or lung infection if the equipment is not cleaned properly. When should CPAP or BPAP be used? In most cases, the mask only needs to be worn during sleep. Generally, the mask needs to be worn throughout the night and during any daytime naps. People with certain medical conditions may also need to wear the mask at other times, such as when they are awake. Follow instructions from your health care provider about when to use the machine. What happens during CPAP or BPAP? Both CPAP and BPAP are provided by a small machine with a flexible plastic tube that attaches to a plastic mask that you wear. Air is blown through the mask into your nose or mouth. The amount of pressure that is used to blow the air can be adjusted on the machine. Your health care provider will set the pressure setting and help you find the best mask for you. Tips for using the mask Because the mask needs to be snug, some people feel trapped or closed-in (claustrophobic) when first using the mask. If you feel this way, you may need to get used to the mask. One way to do this is to hold the mask loosely over your nose or mouth and then gradually apply the mask more snugly. You can also gradually increase the amount of time that you use the mask. Masks are available in  various types and sizes. If your mask does not fit well, talk with your health care provider about getting a different one. Some common types of masks include: Full face masks, which fit over the mouth and nose. Nasal masks, which fit over the nose. Nasal pillow or prong masks, which fit into the nostrils. If you are using a mask that fits over your nose and you tend to breathe through your mouth, a chin strap may be applied to help keep your mouth closed. Use a skin barrier to  protect your skin as told by your health care provider. Some CPAP and BPAP machines have alarms that may sound if the mask comes off or develops a leak. If you have trouble with the mask, it is very important that you talk with your health care provider about finding a way to make the mask easier to tolerate. Do not stop using the mask. There could be a negative impact on your health if you stop using the mask. Tips for using the machine Place your CPAP or BPAP machine on a secure table or stand near an electrical outlet. Know where the on/off switch is on the machine. Follow instructions from your health care provider about how to set the pressure on your machine and when you should use it. Do not eat or drink while the CPAP or BPAP machine is on. Food or fluids could get pushed into your lungs by the pressure of the CPAP or BPAP. For home use, CPAP and BPAP machines can be rented or purchased through home health care companies. Many different brands of machines are available. Renting a machine before purchasing may help you find out which particular machine works well for you. Your health insurance company may also decide which machine you may get. Keep the CPAP or BPAP machine and attachments clean. Ask your health care provider for specific instructions. Check the humidifier if you have a dry stuffy nose or nosebleeds. Make sure it is working correctly. Follow these instructions at home: Take over-the-counter and prescription medicines only as told by your health care provider. Ask if you can take sinus medicine if your sinuses are blocked. Do not use any products that contain nicotine or tobacco. These products include cigarettes, chewing tobacco, and vaping devices, such as e-cigarettes. If you need help quitting, ask your health care provider. Keep all follow-up visits. This is important. Contact a health care provider if: You have redness or pressure sores on your head, face, mouth, or nose  from the mask or head gear. You have trouble using the CPAP or BPAP machine. You cannot tolerate wearing the CPAP or BPAP mask. Someone tells you that you snore even when wearing your CPAP or BPAP. Get help right away if: You have trouble breathing. You feel confused. Summary CPAP and BPAP are methods that use air pressure to keep your airways open and to help you breathe well. If you have trouble with the mask, it is very important that you talk with your health care provider about finding a way to make the mask easier to tolerate. Do not stop using the mask. There could be a negative impact to your health if you stop using the mask. Follow instructions from your health care provider about when to use the machine. This information is not intended to replace advice given to you by your health care provider. Make sure you discuss any questions you have with your health care provider. Document Revised: 04/03/2020 Document Reviewed: 04/03/2020  Elsevier Patient Education  2022 [employer].

## 2021-02-05 ENCOUNTER — Telehealth: Payer: Self-pay | Admitting: Primary Care

## 2021-02-05 NOTE — Assessment & Plan Note (Addendum)
-   Split night sleep study on 01/13/21 showed patient has severe obstructive sleep apnea, AHI 124.6/hr. We have already placed an urgent order for BIPAP machine at pressure setting 25/21cm h20. She did not require supplemental oxygen. Advised patient once she receives BIPAP to start wearing mask every night 4-6 hours or longer. She should not drive if experiencing excessive daytime fatigue or somnolence. Follow-up in 3-4 months with Dr. Annamaria Boots or sooner if needed.

## 2021-02-05 NOTE — Telephone Encounter (Signed)
Called [place] but he did not answer. Left message for him to call us back.

## 2021-02-11 NOTE — Telephone Encounter (Signed)
ATC Brad, phone line was very choppy and I was unable to hear.   Community message sent to [place] with [employer].

## 2021-02-12 NOTE — Telephone Encounter (Signed)
-----   Message -----  From: Miquel Dunn  Sent: 02/11/2021   4:32 PM EDT  To: Miquel Dunn, Vivia Ewing, LPN  Subject: RE: CPAP                                       [place],   No problem at all regarding the phone, its just hard to hear you guys.  I messaged to verify what was needed cause I have the patient on CPAP, and the order was for BIPAP. At the time one order was showing as discontinued and the other was for supplies only.  it looks like you are wanting to order a new BIPAP for patient to replace the CPAP is that correct?  Patient received her CPAP 07-01-17 so I am not sure how that will work.  I will have to let verification review it for ins to process.    Thanks,  [employer]

## 2021-02-12 NOTE — Telephone Encounter (Signed)
-----   Message -----  From: Vivia Ewing, LPN  Sent: 83/06/9189   4:20 PM EDT  To: Leory Plowman New  Subject: CPAP                                           Hi Brad sorry about my crappy phone.   This patient currently has a cpap but we recently sent in a order for bipap. You guys needed some clarification on the order so I was calling to see what additional information was needed. She recently had a split night sleep study and they recommended Bipap.

## 2021-02-12 NOTE — Telephone Encounter (Signed)
New, Rosana Fret, [place], LPN; Reola Calkins, Teressa Senter, I will get this processed and let you know if they reply to me with any issues.   Thank you,   Brad New        Previous Messages   ----- Message -----  From: Vivia Ewing, LPN  Sent: 30/05/6008   4:44 PM EDT  To: Leory Plowman New  Subject: RE: CPAP                                       Yes that is correct we are needing to switch to him to bi-pap.

## 2021-02-23 DIAGNOSIS — Z8673 Personal history of transient ischemic attack (TIA), and cerebral infarction without residual deficits: Secondary | ICD-10-CM | POA: Diagnosis not present

## 2021-02-23 DIAGNOSIS — Z23 Encounter for immunization: Secondary | ICD-10-CM | POA: Diagnosis not present

## 2021-02-23 DIAGNOSIS — Z6838 Body mass index (BMI) 38.0-38.9, adult: Secondary | ICD-10-CM | POA: Diagnosis not present

## 2021-02-23 DIAGNOSIS — E041 Nontoxic single thyroid nodule: Secondary | ICD-10-CM | POA: Diagnosis not present

## 2021-02-23 DIAGNOSIS — E1122 Type 2 diabetes mellitus with diabetic chronic kidney disease: Secondary | ICD-10-CM | POA: Diagnosis not present

## 2021-02-23 DIAGNOSIS — G4733 Obstructive sleep apnea (adult) (pediatric): Secondary | ICD-10-CM | POA: Diagnosis not present

## 2021-02-23 DIAGNOSIS — E782 Mixed hyperlipidemia: Secondary | ICD-10-CM | POA: Diagnosis not present

## 2021-04-29 DIAGNOSIS — E041 Nontoxic single thyroid nodule: Secondary | ICD-10-CM | POA: Diagnosis not present

## 2021-04-29 DIAGNOSIS — Z8673 Personal history of transient ischemic attack (TIA), and cerebral infarction without residual deficits: Secondary | ICD-10-CM | POA: Diagnosis not present

## 2021-04-29 DIAGNOSIS — R413 Other amnesia: Secondary | ICD-10-CM | POA: Diagnosis not present

## 2021-04-29 DIAGNOSIS — R29818 Other symptoms and signs involving the nervous system: Secondary | ICD-10-CM | POA: Diagnosis not present

## 2021-04-29 DIAGNOSIS — N184 Chronic kidney disease, stage 4 (severe): Secondary | ICD-10-CM | POA: Diagnosis not present

## 2021-04-29 DIAGNOSIS — M150 Primary generalized (osteo)arthritis: Secondary | ICD-10-CM | POA: Diagnosis not present

## 2021-04-29 DIAGNOSIS — Z0289 Encounter for other administrative examinations: Secondary | ICD-10-CM | POA: Diagnosis not present

## 2021-04-29 DIAGNOSIS — E1122 Type 2 diabetes mellitus with diabetic chronic kidney disease: Secondary | ICD-10-CM | POA: Diagnosis not present

## 2021-04-30 DIAGNOSIS — N184 Chronic kidney disease, stage 4 (severe): Secondary | ICD-10-CM | POA: Diagnosis not present

## 2021-04-30 DIAGNOSIS — E041 Nontoxic single thyroid nodule: Secondary | ICD-10-CM | POA: Diagnosis not present

## 2021-04-30 DIAGNOSIS — Z8673 Personal history of transient ischemic attack (TIA), and cerebral infarction without residual deficits: Secondary | ICD-10-CM | POA: Diagnosis not present

## 2021-04-30 DIAGNOSIS — E782 Mixed hyperlipidemia: Secondary | ICD-10-CM | POA: Diagnosis not present

## 2021-04-30 DIAGNOSIS — Z6838 Body mass index (BMI) 38.0-38.9, adult: Secondary | ICD-10-CM | POA: Diagnosis not present

## 2021-04-30 DIAGNOSIS — E1122 Type 2 diabetes mellitus with diabetic chronic kidney disease: Secondary | ICD-10-CM | POA: Diagnosis not present

## 2021-04-30 DIAGNOSIS — G4733 Obstructive sleep apnea (adult) (pediatric): Secondary | ICD-10-CM | POA: Diagnosis not present

## 2021-05-12 DIAGNOSIS — I129 Hypertensive chronic kidney disease with stage 1 through stage 4 chronic kidney disease, or unspecified chronic kidney disease: Secondary | ICD-10-CM | POA: Diagnosis not present

## 2021-05-12 DIAGNOSIS — I639 Cerebral infarction, unspecified: Secondary | ICD-10-CM | POA: Diagnosis not present

## 2021-05-12 DIAGNOSIS — Z6841 Body Mass Index (BMI) 40.0 and over, adult: Secondary | ICD-10-CM | POA: Diagnosis not present

## 2021-05-12 DIAGNOSIS — R32 Unspecified urinary incontinence: Secondary | ICD-10-CM | POA: Diagnosis not present

## 2021-05-12 DIAGNOSIS — E7800 Pure hypercholesterolemia, unspecified: Secondary | ICD-10-CM | POA: Diagnosis not present

## 2021-05-12 DIAGNOSIS — E1122 Type 2 diabetes mellitus with diabetic chronic kidney disease: Secondary | ICD-10-CM | POA: Diagnosis not present

## 2021-05-12 DIAGNOSIS — N183 Chronic kidney disease, stage 3 unspecified: Secondary | ICD-10-CM | POA: Diagnosis not present

## 2021-05-31 ENCOUNTER — Other Ambulatory Visit (HOSPITAL_BASED_OUTPATIENT_CLINIC_OR_DEPARTMENT_OTHER): Payer: Self-pay

## 2021-05-31 ENCOUNTER — Ambulatory Visit: Payer: Medicare Other | Admitting: Internal Medicine

## 2021-05-31 MED ORDER — TRULICITY 4.5 MG/0.5ML ~~LOC~~ SOAJ
SUBCUTANEOUS | 0 refills | Status: DC
  Filled 2021-05-31 (×2): qty 2, 28d supply, fill #0
  Filled 2021-06-27: qty 2, 28d supply, fill #1
  Filled 2021-08-01 – 2021-08-19 (×6): qty 2, 28d supply, fill #2

## 2021-06-02 ENCOUNTER — Other Ambulatory Visit (HOSPITAL_BASED_OUTPATIENT_CLINIC_OR_DEPARTMENT_OTHER): Payer: Self-pay

## 2021-06-03 ENCOUNTER — Other Ambulatory Visit (HOSPITAL_BASED_OUTPATIENT_CLINIC_OR_DEPARTMENT_OTHER): Payer: Self-pay

## 2021-06-03 ENCOUNTER — Other Ambulatory Visit: Payer: Self-pay | Admitting: *Deleted

## 2021-06-03 DIAGNOSIS — Z87891 Personal history of nicotine dependence: Secondary | ICD-10-CM

## 2021-06-22 ENCOUNTER — Telehealth: Payer: Self-pay | Admitting: Acute Care

## 2021-06-22 NOTE — Telephone Encounter (Signed)
Spoke with pt and verified that she quit smoking in 2006. I advised pt that she no longer qualifies for lung cancer screening. Pt verbalized understanding. Current CT appt cancelled.

## 2021-06-23 ENCOUNTER — Inpatient Hospital Stay: Admission: RE | Admit: 2021-06-23 | Payer: Medicare Other | Source: Ambulatory Visit

## 2021-06-28 ENCOUNTER — Other Ambulatory Visit (HOSPITAL_BASED_OUTPATIENT_CLINIC_OR_DEPARTMENT_OTHER): Payer: Self-pay

## 2021-08-02 ENCOUNTER — Other Ambulatory Visit (HOSPITAL_BASED_OUTPATIENT_CLINIC_OR_DEPARTMENT_OTHER): Payer: Self-pay

## 2021-08-04 ENCOUNTER — Other Ambulatory Visit: Payer: Self-pay | Admitting: Internal Medicine

## 2021-08-04 ENCOUNTER — Other Ambulatory Visit (HOSPITAL_BASED_OUTPATIENT_CLINIC_OR_DEPARTMENT_OTHER): Payer: Self-pay

## 2021-08-04 DIAGNOSIS — I10 Essential (primary) hypertension: Secondary | ICD-10-CM

## 2021-08-04 DIAGNOSIS — N184 Chronic kidney disease, stage 4 (severe): Secondary | ICD-10-CM

## 2021-08-04 DIAGNOSIS — I700 Atherosclerosis of aorta: Secondary | ICD-10-CM

## 2021-08-04 DIAGNOSIS — N183 Hypertensive chronic kidney disease with stage 1 through stage 4 chronic kidney disease, or unspecified chronic kidney disease: Secondary | ICD-10-CM

## 2021-08-04 DIAGNOSIS — E785 Hyperlipidemia, unspecified: Secondary | ICD-10-CM

## 2021-08-12 ENCOUNTER — Other Ambulatory Visit (HOSPITAL_BASED_OUTPATIENT_CLINIC_OR_DEPARTMENT_OTHER): Payer: Self-pay

## 2021-08-18 ENCOUNTER — Other Ambulatory Visit (HOSPITAL_BASED_OUTPATIENT_CLINIC_OR_DEPARTMENT_OTHER): Payer: Self-pay

## 2021-08-19 ENCOUNTER — Other Ambulatory Visit (HOSPITAL_BASED_OUTPATIENT_CLINIC_OR_DEPARTMENT_OTHER): Payer: Self-pay

## 2021-09-21 ENCOUNTER — Other Ambulatory Visit (HOSPITAL_BASED_OUTPATIENT_CLINIC_OR_DEPARTMENT_OTHER): Payer: Self-pay

## 2021-09-21 MED ORDER — TRULICITY 4.5 MG/0.5ML ~~LOC~~ SOAJ
SUBCUTANEOUS | 0 refills | Status: AC
  Filled 2021-09-21: qty 2, 28d supply, fill #0
  Filled 2021-10-22: qty 2, 28d supply, fill #1
  Filled 2021-11-27: qty 2, 28d supply, fill #2

## 2021-09-22 ENCOUNTER — Telehealth: Payer: Self-pay | Admitting: Primary Care

## 2021-09-23 NOTE — Telephone Encounter (Signed)
Called and spoke with pt to see if she was still wearing her BIPAP machine and she said she has not worn it in abotu 4-5 months. Stated to pt due to her having severe OSA that it is crucial that she uses her machine. Pt is due for an appt as last appt was 02/02/21. Asked pt if she would be okay if we scheduled an appt to have the BIPAP and all further discussed and she verbalized understanding. Appt scheduled. Nothing further needed.

## 2021-09-29 ENCOUNTER — Ambulatory Visit: Payer: Medicare Other | Admitting: Primary Care

## 2021-09-29 ENCOUNTER — Encounter: Payer: Self-pay | Admitting: Primary Care

## 2021-09-29 DIAGNOSIS — G4733 Obstructive sleep apnea (adult) (pediatric): Secondary | ICD-10-CM

## 2021-09-29 NOTE — Assessment & Plan Note (Addendum)
-   Split night sleep study on 01/13/21 showed severe OSA, AHI 124.6/hr. Started on BIPAP 25/21cm h20. Received machine in November 2022 but has not been consistently wearing. Reviewed risks of untreted sleep apnea. Encourage she aim to wear BIPAP every night min 4-6 hours or longer. She needs to contact [employer] to set up a visit in person for CPAP teaching. If still having difficulty tolerating would recommend mask desensitization study in sleep lab. FU in 6-8 weeks for compliance check.

## 2021-09-29 NOTE — Progress Notes (Signed)
_0  ID: Robin Foley, female    DOB: 15-May-1955, 66 y.o.   MRN: [id number]  Chief Complaint  Patient presents with   Follow-up    She is having trouble wearing her CPAP.     Referring provider: Deland Pretty, MD  HPI: 66 year old female, former smoker quit in 2006 (33-pack-year history).  Past medical history significant for OSA, hypertension, stroke, Conn's syndrome, type 1 diabetes, chronic kidney disease stage III, morbid obesity.  Patient of Dr. Annamaria Boots  - NPSG 08/09/2011 severe obstructive sleep apnea, AHI 102/hr with SPO2 low 84%   - CPAP and adequate and titrated to BiPAP 20/16 with residual apneas and desaturation. Body weight 277 pounds.   - Home sleep study 04/12/2017, AHI 60.4 an hour with O2 low 54%, body weight 272 pounds.   - CPAP titration study 10/26/1915 [facility], min sat 90% - Split night sleep study on 01/13/21 showed severe OSA, AHI 124.6/hr. Started on BIPAP 25/21cm h20.   Previous [facility] pulmonary encounter: 11/03/2020 Patient presents today for overdue follow-up, she was last seen in September 2019. She was on CPAP at 17cm h20, may need to change to BIPAP if unable to tolerate d/t higher pressure. She has not used CPAP in over 2 years. She had trouble with water chamber and was unsure if she could use as one of your tubes got wet. There is a little bit of a learning barrier d/t mild cognitive impairment, she lives with her husband. She is open to resuming PAP use. She has lost 30-40 lbs since last sleep study.  She goes to bed at midnight and get out of bed to start her day at 12pm. She wakes up frequently at night, states that it is not a restful sleep.   02/02/2021   Patient presents today for follow-up. She experiences daytime fatigue, she has been off CPAP for several years. Sleep study showed severe OSA, AHI 124.6/hr. Optimal pressure 25/21cm h20 with medium size [employer] full faced mask with heated humidification. We reviewed sleep study results and discussed  treatment options, recommend she be started on BIPAP d.t severity of her sleep apnea. Optimal pressure 25/21cm h20. Patient agreeing with treatment plan.   09/29/2021 Interim hx  Patient presents today for overdue OSA follow-up. Since last visit she received bipap and used it for approx 1 week. She has not used it in 4-5 months. She stopped wearing her BIPAP d/t tolerance issues possibly related to mask fit, although,  female companion states that was not the issue. She is open to resuming use. She still has machine and is receiving supplies. We reviewed risks of untreated sleep apnea including cardiac arrhythmias, pulmonary HTN, stroke and diabetes. She is working on weight loss efforts.    Allergies  Allergen Reactions   Penicillins Hives    Immunization History  Administered Date(s) Administered   Influenza, High Dose Seasonal PF 03/21/2013, 01/17/2017   Influenza, Quadrivalent, Recombinant, Inj, Pf 02/13/2018, 04/09/2020, 02/23/2021   Influenza,inj,Quad PF,6+ Mos 01/13/2017   Influenza-Unspecified 03/21/2014, 01/29/2015, 01/19/2016, 01/07/2018   Pneumococcal Conjugate-13 03/21/2013   Pneumococcal Polysaccharide-23 06/28/2005   Tdap 01/17/2017    Past Medical History:  Diagnosis Date   Chronic kidney disease    Conn syndrome ([place])    elevated plasma-renin-aldosterone ratio   Diabetes mellitus    insulin-dependent    History of nuclear stress test 07/22/2011   lexiscan; normal pattern of perfusion, low risk    Hyperlipidemia    Malignant hypertension    Memory loss  Sleep apnea 08/2011   AHI during total sleep 102.40/hr and during REM 0.00/hr (severe sleep apnea   Stroke [facility]) 2006   right hemiplegia     Tobacco History: Social History   Tobacco Use  Smoking Status Former   Packs/day: 1.20   Years: 28.00   Pack years: 33.60   Types: Cigarettes   Quit date: 05/11/2004   Years since quitting: 17.3  Smokeless Tobacco Never   Counseling given: Not  Answered   Outpatient Medications Prior to Visit  Medication Sig Dispense Refill   acetaminophen (TYLENOL) 650 MG CR tablet Take 650 mg every 8 (eight) hours as needed by mouth for pain.     amLODipine (NORVASC) 10 MG tablet Take 10 mg by mouth daily.     aspirin 81 MG tablet Take 81 mg by mouth daily.     atorvastatin (LIPITOR) 40 MG tablet TAKE 1 TABLET BY MOUTH EVERY DAY 90 tablet 0   BAYER CONTOUR TEST test strip      BAYER MICROLET LANCETS lancets      Continuous Blood Gluc Sensor (FREESTYLE LIBRE 2 SENSOR) MISC REPLACE 1 SENSOR EVERY 14 DAYS     Dulaglutide (TRULICITY) 4.5 YT/0.1SW SOPN 4.96m Subcutaneous Once a week 90 days 6 mL 0   fesoterodine (TOVIAZ) 8 MG TB24 tablet Take 8 mg daily by mouth.     JARDIANCE 10 MG TABS tablet Take 10 mg by mouth daily.     labetalol (NORMODYNE) 200 MG tablet Take 400 mg by mouth 2 (two) times daily. Take 2 tablets (4063m bid     losartan (COZAAR) 50 MG tablet Take 50 mg by mouth daily.     NOVOFINE 32G X 6 MM MISC      spironolactone (ALDACTONE) 25 MG tablet Take 12.5 mg by mouth 2 (two) times daily.     TOUJEO SOLOSTAR 300 UNIT/ML Solostar Pen Inject 28 Units into the skin.     Calcium-Magnesium-Vitamin D (CALCIUM 1200+D3 PO) Take by mouth 2 (two) times daily. (Patient not taking: Reported on 09/29/2021)     fluconazole (DIFLUCAN) 150 MG tablet Take 150 mg by mouth once. (Patient not taking: Reported on 09/29/2021)     Insulin Glargine (BASAGLAR KWIKPEN) 100 UNIT/ML SOPN Inject into the skin. 30 units BID (Patient not taking: Reported on 09/29/2021)     Semaglutide,0.25 or 0.5MG/DOS, 2 MG/1.5ML SOPN Inject 1 mg into the skin once a week. (Patient not taking: Reported on 09/29/2021)     No facility-administered medications prior to visit.    Review of Systems  Review of Systems  Constitutional: Negative.   HENT: Negative.    Respiratory: Negative.    Cardiovascular: Negative.     Physical Exam  BP 110/68 (BP Location: Left Arm, Cuff Size:  Normal)   Pulse 78   Ht 5' 6.5" (1.689 m)   Wt 239 lb 9.6 oz (108.7 kg)   SpO2 100%   BMI 38.09 kg/m  Physical Exam Constitutional:      General: She is not in acute distress.    Appearance: Normal appearance. She is obese. She is not ill-appearing.  HENT:     Head: Normocephalic and atraumatic.     Mouth/Throat:     Mouth: Mucous membranes are moist.     Pharynx: Oropharynx is clear.  Cardiovascular:     Rate and Rhythm: Normal rate and regular rhythm.  Pulmonary:     Effort: Pulmonary effort is normal.     Breath sounds: Normal breath sounds.  Musculoskeletal:        General: Normal range of motion.  Skin:    General: Skin is warm and dry.  Neurological:     General: No focal deficit present.     Mental Status: She is alert and oriented to person, place, and time. Mental status is at baseline.  Psychiatric:        Mood and Affect: Mood normal.        Behavior: Behavior normal.        Thought Content: Thought content normal.        Judgment: Judgment normal.     Lab Results:  CBC    Component Value Date/Time   WBC 7.1 01/17/2007 1100   RBC 4.05 01/17/2007 1100   HGB 10.4 (L) 01/17/2007 1100   HCT 32.2 (L) 01/17/2007 1100   PLT 224 01/17/2007 1100   MCV 79.3 01/17/2007 1100   MCHC 32.2 01/17/2007 1100   RDW 15.8 (H) 01/17/2007 1100   LYMPHSABS 1.2 01/17/2007 1100   MONOABS 0.4 01/17/2007 1100   EOSABS 0.2 01/17/2007 1100   BASOSABS 0.0 01/17/2007 1100    BMET    Component Value Date/Time   NA 141 03/20/2013 1041   K 4.2 03/20/2013 1041   CL 104 03/20/2013 1041   CO2 28 03/20/2013 1041   GLUCOSE 66 (L) 03/20/2013 1041   BUN 17 03/20/2013 1041   CREATININE 1.83 (H) 03/20/2013 1041   CALCIUM 9.3 03/20/2013 1041   GFRNONAA >60 01/17/2007 1100   GFRAA  01/17/2007 1100    >60        The eGFR has been calculated using the MDRD equation. This calculation has not been validated in all clinical    BNP No results found for: BNP  ProBNP No results  found for: PROBNP  Imaging: No results found.   Assessment & Plan:   OSA (obstructive sleep apnea) - Split night sleep study on 01/13/21 showed severe OSA, AHI 124.6/hr. Started on BIPAP 25/21cm h20. Received machine in November 2022 but has not been consistently wearing. Reviewed risks of untreted sleep apnea. Encourage she aim to wear BIPAP every night min 4-6 hours or longer. She needs to contact [employer] to set up a visit in person for CPAP teaching. If still having difficulty tolerating would recommend mask desensitization study in sleep lab. FU in 6-8 weeks for compliance check.    Martyn Ehrich, NP 09/29/2021

## 2021-09-29 NOTE — Patient Instructions (Addendum)
Split night sleep study showed that you have severe obstructive sleep apnea  Untreated sleep apnea puts you at higher risk for cardiac arrhythmias, pulmonary HTN, stroke and diabetes  Resume CPAP, if still having difficulty tolerating would recommend mask desensitization study in sleep lab  Recommendations - It is extremely important that you wear your BIPAP every night for 4 to 6 hours or longer - Please call [facility] to set up an appointment to have them retrain you on how to use CPAP machine/ Their phone number is [phone])  Follow-up: - 6-8 weeks with Eustaquio Maize NP in office for BIPAP compliance    CPAP and BIPAP Information CPAP and BIPAP are methods that use air pressure to keep your airways open and to help you breathe well. CPAP and BIPAP use different amounts of pressure. Your health care provider will tell you whether CPAP or BIPAP would be more helpful for you. CPAP stands for "continuous positive airway pressure." With CPAP, the amount of pressure stays the same while you breathe in (inhale) and out (exhale). BIPAP stands for "bi-level positive airway pressure." With BIPAP, the amount of pressure will be higher when you inhale and lower when you exhale. This allows you to take larger breaths. CPAP or BIPAP may be used in the hospital, or your health care provider may want you to use it at home. You may need to have a sleep study before your health care provider can order a machine for you to use at home. What are the advantages? CPAP or BIPAP can be helpful if you have: Sleep apnea. Chronic obstructive pulmonary disease (COPD). Heart failure. Medical conditions that cause muscle weakness, including muscular dystrophy or amyotrophic lateral sclerosis (ALS). Other problems that cause breathing to be shallow, weak, abnormal, or difficult. CPAP and BIPAP are most commonly used for obstructive sleep apnea (OSA) to keep the airways from collapsing when the muscles relax during  sleep. What are the risks? Generally, this is a safe treatment. However, problems may occur, including: Irritated skin or skin sores if the mask does not fit properly. Dry or stuffy nose or nosebleeds. Dry mouth. Feeling gassy or bloated. Sinus or lung infection if the equipment is not cleaned properly. When should CPAP or BIPAP be used? In most cases, the mask only needs to be worn during sleep. Generally, the mask needs to be worn throughout the night and during any daytime naps. People with certain medical conditions may also need to wear the mask at other times, such as when they are awake. Follow instructions from your health care provider about when to use the machine. What happens during CPAP or BIPAP?  Both CPAP and BIPAP are provided by a small machine with a flexible plastic tube that attaches to a plastic mask that you wear. Air is blown through the mask into your nose or mouth. The amount of pressure that is used to blow the air can be adjusted on the machine. Your health care provider will set the pressure setting and help you find the best mask for you. Tips for using the mask Because the mask needs to be snug, some people feel trapped or closed-in (claustrophobic) when first using the mask. If you feel this way, you may need to get used to the mask. One way to do this is to hold the mask loosely over your nose or mouth and then gradually apply the mask more snugly. You can also gradually increase the amount of time that you use the mask.  Masks are available in various types and sizes. If your mask does not fit well, talk with your health care provider about getting a different one. Some common types of masks include: Full face masks, which fit over the mouth and nose. Nasal masks, which fit over the nose. Nasal pillow or prong masks, which fit into the nostrils. If you are using a mask that fits over your nose and you tend to breathe through your mouth, a chin strap may be applied  to help keep your mouth closed. Use a skin barrier to protect your skin as told by your health care provider. Some CPAP and BIPAP machines have alarms that may sound if the mask comes off or develops a leak. If you have trouble with the mask, it is very important that you talk with your health care provider about finding a way to make the mask easier to tolerate. Do not stop using the mask. There could be a negative impact on your health if you stop using the mask. Tips for using the machine Place your CPAP or BIPAP machine on a secure table or stand near an electrical outlet. Know where the on/off switch is on the machine. Follow instructions from your health care provider about how to set the pressure on your machine and when you should use it. Do not eat or drink while the CPAP or BIPAP machine is on. Food or fluids could get pushed into your lungs by the pressure of the CPAP or BIPAP. For home use, CPAP and BIPAP machines can be rented or purchased through home health care companies. Many different brands of machines are available. Renting a machine before purchasing may help you find out which particular machine works well for you. Your health insurance company may also decide which machine you may get. Keep the CPAP or BIPAP machine and attachments clean. Ask your health care provider for specific instructions. Check the humidifier if you have a dry stuffy nose or nosebleeds. Make sure it is working correctly. Follow these instructions at home: Take over-the-counter and prescription medicines only as told by your health care provider. Ask if you can take sinus medicine if your sinuses are blocked. Do not use any products that contain nicotine or tobacco. These products include cigarettes, chewing tobacco, and vaping devices, such as e-cigarettes. If you need help quitting, ask your health care provider. Keep all follow-up visits. This is important. Contact a health care provider if: You have  redness or pressure sores on your head, face, mouth, or nose from the mask or head gear. You have trouble using the CPAP or BIPAP machine. You cannot tolerate wearing the CPAP or BIPAP mask. Someone tells you that you snore even when wearing your CPAP or BIPAP. Get help right away if: You have trouble breathing. You feel confused. Summary CPAP and BIPAP are methods that use air pressure to keep your airways open and to help you breathe well. If you have trouble with the mask, it is very important that you talk with your health care provider about finding a way to make the mask easier to tolerate. Do not stop using the mask. There could be a negative impact to your health if you stop using the mask. Follow instructions from your health care provider about when to use the machine. This information is not intended to replace advice given to you by your health care provider. Make sure you discuss any questions you have with your health care provider. Document Revised:  12/02/2020 Document Reviewed: 04/03/2020 Elsevier Patient Education  [employer].

## 2021-10-19 ENCOUNTER — Telehealth: Payer: Self-pay | Admitting: Primary Care

## 2021-10-20 NOTE — Telephone Encounter (Signed)
Called patient but she did not answer. Her VM was not setup. Will attempt to call back later.

## 2021-10-22 ENCOUNTER — Other Ambulatory Visit (HOSPITAL_BASED_OUTPATIENT_CLINIC_OR_DEPARTMENT_OTHER): Payer: Self-pay

## 2021-11-01 ENCOUNTER — Other Ambulatory Visit (HOSPITAL_COMMUNITY): Payer: Self-pay

## 2021-11-02 NOTE — Telephone Encounter (Signed)
I have sent a message to our RT team to review to see what we can do and to provide assistance for the patient.     Thank you,   Luellen Pucker  10/02/21 per Nida Boatman.

## 2021-11-24 ENCOUNTER — Other Ambulatory Visit: Payer: Self-pay | Admitting: Internal Medicine

## 2021-11-24 DIAGNOSIS — I700 Atherosclerosis of aorta: Secondary | ICD-10-CM

## 2021-11-24 DIAGNOSIS — E785 Hyperlipidemia, unspecified: Secondary | ICD-10-CM

## 2021-11-24 DIAGNOSIS — N183 Chronic kidney disease, stage 3 unspecified: Secondary | ICD-10-CM

## 2021-11-24 DIAGNOSIS — N184 Chronic kidney disease, stage 4 (severe): Secondary | ICD-10-CM

## 2021-11-24 DIAGNOSIS — I10 Essential (primary) hypertension: Secondary | ICD-10-CM

## 2021-11-29 ENCOUNTER — Other Ambulatory Visit (HOSPITAL_BASED_OUTPATIENT_CLINIC_OR_DEPARTMENT_OTHER): Payer: Self-pay

## 2021-12-01 ENCOUNTER — Ambulatory Visit: Payer: 59 | Admitting: Primary Care

## 2021-12-10 ENCOUNTER — Other Ambulatory Visit: Payer: Self-pay | Admitting: Internal Medicine

## 2021-12-10 DIAGNOSIS — E785 Hyperlipidemia, unspecified: Secondary | ICD-10-CM

## 2021-12-10 DIAGNOSIS — N183 Chronic kidney disease, stage 3 unspecified: Secondary | ICD-10-CM

## 2021-12-10 DIAGNOSIS — I700 Atherosclerosis of aorta: Secondary | ICD-10-CM

## 2021-12-10 DIAGNOSIS — N184 Chronic kidney disease, stage 4 (severe): Secondary | ICD-10-CM

## 2021-12-10 DIAGNOSIS — I10 Essential (primary) hypertension: Secondary | ICD-10-CM

## 2021-12-12 ENCOUNTER — Other Ambulatory Visit: Payer: Self-pay | Admitting: Internal Medicine

## 2021-12-12 DIAGNOSIS — N184 Chronic kidney disease, stage 4 (severe): Secondary | ICD-10-CM

## 2021-12-12 DIAGNOSIS — E785 Hyperlipidemia, unspecified: Secondary | ICD-10-CM

## 2021-12-12 DIAGNOSIS — I700 Atherosclerosis of aorta: Secondary | ICD-10-CM

## 2021-12-12 DIAGNOSIS — I10 Essential (primary) hypertension: Secondary | ICD-10-CM

## 2021-12-12 DIAGNOSIS — N183 Chronic kidney disease, stage 3 unspecified: Secondary | ICD-10-CM

## 2021-12-24 ENCOUNTER — Other Ambulatory Visit (HOSPITAL_BASED_OUTPATIENT_CLINIC_OR_DEPARTMENT_OTHER): Payer: Self-pay

## 2021-12-27 ENCOUNTER — Other Ambulatory Visit (HOSPITAL_BASED_OUTPATIENT_CLINIC_OR_DEPARTMENT_OTHER): Payer: Self-pay

## 2021-12-27 MED ORDER — TRULICITY 4.5 MG/0.5ML ~~LOC~~ SOAJ
SUBCUTANEOUS | 0 refills | Status: DC
  Filled 2021-12-27: qty 2, 28d supply, fill #0

## 2022-01-03 ENCOUNTER — Other Ambulatory Visit (HOSPITAL_BASED_OUTPATIENT_CLINIC_OR_DEPARTMENT_OTHER): Payer: Self-pay

## 2022-01-05 ENCOUNTER — Other Ambulatory Visit (HOSPITAL_BASED_OUTPATIENT_CLINIC_OR_DEPARTMENT_OTHER): Payer: Self-pay

## 2022-01-05 MED ORDER — TRULICITY 4.5 MG/0.5ML ~~LOC~~ SOAJ
4.5000 mg | SUBCUTANEOUS | 2 refills | Status: AC
  Filled 2022-01-05 – 2022-08-23 (×2): qty 2, 28d supply, fill #0
  Filled 2022-09-25: qty 2, 28d supply, fill #1

## 2022-01-11 ENCOUNTER — Ambulatory Visit: Payer: Medicare Other | Admitting: Primary Care

## 2022-01-11 ENCOUNTER — Encounter: Payer: Self-pay | Admitting: Primary Care

## 2022-01-11 DIAGNOSIS — G4733 Obstructive sleep apnea (adult) (pediatric): Secondary | ICD-10-CM

## 2022-01-11 NOTE — Patient Instructions (Addendum)
Split night sleep study showed that you have severe obstructive sleep apnea.  You stopped breathing over 100 times an hour.  Severe sleep apnea that goes untreated puts you at high risk for cardiac arrhythmias, stroke, pulmonary hypertension and diabetes  You need to consistently wear CPAP every single night for the duration of your sleep  Recommendations - Continue to wear BiPAP every night for 6 hours or longer - Avoid alcohol use prior to bedtime - Do not drive experiencing excessive daytime sleepiness or fatigue - If you continue to struggle with BiPAP you may be a candidate for inspire device, we would need to refer you to ear nose and throat for initial assessment  Follow-up: - 3 months with Dr. Annamaria Boots    CPAP and BIPAP Information CPAP and BIPAP are methods that use air pressure to keep your airways open and to help you breathe well. CPAP and BIPAP use different amounts of pressure. Your health care provider will tell you whether CPAP or BIPAP would be more helpful for you. CPAP stands for "continuous positive airway pressure." With CPAP, the amount of pressure stays the same while you breathe in (inhale) and out (exhale). BIPAP stands for "bi-level positive airway pressure." With BIPAP, the amount of pressure will be higher when you inhale and lower when you exhale. This allows you to take larger breaths. CPAP or BIPAP may be used in the hospital, or your health care provider may want you to use it at home. You may need to have a sleep study before your health care provider can order a machine for you to use at home. What are the advantages? CPAP or BIPAP can be helpful if you have: Sleep apnea. Chronic obstructive pulmonary disease (COPD). Heart failure. Medical conditions that cause muscle weakness, including muscular dystrophy or amyotrophic lateral sclerosis (ALS). Other problems that cause breathing to be shallow, weak, abnormal, or difficult. CPAP and BIPAP are most commonly  used for obstructive sleep apnea (OSA) to keep the airways from collapsing when the muscles relax during sleep. What are the risks? Generally, this is a safe treatment. However, problems may occur, including: Irritated skin or skin sores if the mask does not fit properly. Dry or stuffy nose or nosebleeds. Dry mouth. Feeling gassy or bloated. Sinus or lung infection if the equipment is not cleaned properly. When should CPAP or BIPAP be used? In most cases, the mask only needs to be worn during sleep. Generally, the mask needs to be worn throughout the night and during any daytime naps. People with certain medical conditions may also need to wear the mask at other times, such as when they are awake. Follow instructions from your health care provider about when to use the machine. What happens during CPAP or BIPAP?  Both CPAP and BIPAP are provided by a small machine with a flexible plastic tube that attaches to a plastic mask that you wear. Air is blown through the mask into your nose or mouth. The amount of pressure that is used to blow the air can be adjusted on the machine. Your health care provider will set the pressure setting and help you find the best mask for you. Tips for using the mask Because the mask needs to be snug, some people feel trapped or closed-in (claustrophobic) when first using the mask. If you feel this way, you may need to get used to the mask. One way to do this is to hold the mask loosely over your nose or mouth  and then gradually apply the mask more snugly. You can also gradually increase the amount of time that you use the mask. Masks are available in various types and sizes. If your mask does not fit well, talk with your health care provider about getting a different one. Some common types of masks include: Full face masks, which fit over the mouth and nose. Nasal masks, which fit over the nose. Nasal pillow or prong masks, which fit into the nostrils. If you are using  a mask that fits over your nose and you tend to breathe through your mouth, a chin strap may be applied to help keep your mouth closed. Use a skin barrier to protect your skin as told by your health care provider. Some CPAP and BIPAP machines have alarms that may sound if the mask comes off or develops a leak. If you have trouble with the mask, it is very important that you talk with your health care provider about finding a way to make the mask easier to tolerate. Do not stop using the mask. There could be a negative impact on your health if you stop using the mask. Tips for using the machine Place your CPAP or BIPAP machine on a secure table or stand near an electrical outlet. Know where the on/off switch is on the machine. Follow instructions from your health care provider about how to set the pressure on your machine and when you should use it. Do not eat or drink while the CPAP or BIPAP machine is on. Food or fluids could get pushed into your lungs by the pressure of the CPAP or BIPAP. For home use, CPAP and BIPAP machines can be rented or purchased through home health care companies. Many different brands of machines are available. Renting a machine before purchasing may help you find out which particular machine works well for you. Your health insurance company may also decide which machine you may get. Keep the CPAP or BIPAP machine and attachments clean. Ask your health care provider for specific instructions. Check the humidifier if you have a dry stuffy nose or nosebleeds. Make sure it is working correctly. Follow these instructions at home: Take over-the-counter and prescription medicines only as told by your health care provider. Ask if you can take sinus medicine if your sinuses are blocked. Do not use any products that contain nicotine or tobacco. These products include cigarettes, chewing tobacco, and vaping devices, such as e-cigarettes. If you need help quitting, ask your health care  provider. Keep all follow-up visits. This is important. Contact a health care provider if: You have redness or pressure sores on your head, face, mouth, or nose from the mask or head gear. You have trouble using the CPAP or BIPAP machine. You cannot tolerate wearing the CPAP or BIPAP mask. Someone tells you that you snore even when wearing your CPAP or BIPAP. Get help right away if: You have trouble breathing. You feel confused. Summary CPAP and BIPAP are methods that use air pressure to keep your airways open and to help you breathe well. If you have trouble with the mask, it is very important that you talk with your health care provider about finding a way to make the mask easier to tolerate. Do not stop using the mask. There could be a negative impact to your health if you stop using the mask. Follow instructions from your health care provider about when to use the machine. This information is not intended to replace advice given  to you by your health care provider. Make sure you discuss any questions you have with your health care provider. Document Revised: 12/02/2020 Document Reviewed: 04/03/2020 Elsevier Patient Education  [employer].

## 2022-01-11 NOTE — Progress Notes (Signed)
'@Patient'  ID: Robin Foley, female    DOB: 06-12-1955, 66 y.o.   MRN: [id number]  Chief Complaint  Patient presents with   Follow-up    Referring provider: Deland Pretty, MD  HPI: 66 year old female, former smoker quit in 2006 (33-pack-year history).  Past medical history significant for OSA, hypertension, stroke, Conn's syndrome, type 1 diabetes, chronic kidney disease stage III, morbid obesity.  Patient of Dr. Annamaria Boots  - NPSG 08/09/2011 severe obstructive sleep apnea, AHI 102/hr with SPO2 low 84%   - CPAP and adequate and titrated to BiPAP 20/16 with residual apneas and desaturation. Body weight 277 pounds.   - Home sleep study 04/12/2017, AHI 60.4 an hour with O2 low 54%, body weight 272 pounds.   - CPAP titration study 10/26/1915 CWP, min sat 90% - Split night sleep study on 01/13/21 showed severe OSA, AHI 124.6/hr. Started on BIPAP 25/21cm h20.   Previous [facility] pulmonary encounter: 11/03/2020 Patient presents today for overdue follow-up, she was last seen in September 2019. She was on CPAP at 17cm h20, may need to change to BIPAP if unable to tolerate d/t higher pressure. She has not used CPAP in over 2 years. She had trouble with water chamber and was unsure if she could use as one of your tubes got wet. There is a little bit of a learning barrier d/t mild cognitive impairment, she lives with her husband. She is open to resuming PAP use. She has lost 30-40 lbs since last sleep study.  She goes to bed at midnight and get out of bed to start her day at 12pm. She wakes up frequently at night, states that it is not a restful sleep.   02/02/2021   Patient presents today for follow-up. She experiences daytime fatigue, she has been off CPAP for several years. Sleep study showed severe OSA, AHI 124.6/hr. Optimal pressure 25/21cm h20 with medium size [employer] full faced mask with heated humidification. We reviewed sleep study results and discussed treatment options, recommend she be started on  BIPAP d.t severity of her sleep apnea. Optimal pressure 25/21cm h20. Patient agreeing with treatment plan.   09/29/2021 Patient presents today for overdue OSA follow-up. Since last visit she received bipap and used it for approx 1 week. She has not used it in 4-5 months. She stopped wearing her BIPAP d/t tolerance issues possibly related to mask fit, although,  female companion states that was not the issue. She is open to resuming use. She still has machine and is receiving supplies. We reviewed risks of untreated sleep apnea including cardiac arrhythmias, pulmonary HTN, stroke and diabetes. She is working on weight loss efforts.   01/11/2022 Patient presents today for OSA follow-up. She was having trouble figuring out how to use CPAP machine, she was able to meet with [employer] and get re-trained. Since then she has been wearing CPAP nightly. She will take it up around 4am.    Allergies  Allergen Reactions   Penicillins Hives    Immunization History  Administered Date(s) Administered   Influenza, High Dose Seasonal PF 03/21/2013, 01/17/2017   Influenza, Quadrivalent, Recombinant, Inj, Pf 02/13/2018, 04/09/2020, 02/23/2021   Influenza,inj,Quad PF,6+ Mos 01/13/2017   Influenza-Unspecified 03/21/2014, 01/29/2015, 01/19/2016, 01/07/2018   Pneumococcal Conjugate-13 03/21/2013   Pneumococcal Polysaccharide-23 06/28/2005   Tdap 01/17/2017    Past Medical History:  Diagnosis Date   Chronic kidney disease    Conn syndrome ([place])    elevated plasma-renin-aldosterone ratio   Diabetes mellitus    insulin-dependent  History of nuclear stress test 07/22/2011   lexiscan; normal pattern of perfusion, low risk    Hyperlipidemia    Malignant hypertension    Memory loss    Sleep apnea 08/2011   AHI during total sleep 102.40/hr and during REM 0.00/hr (severe sleep apnea   Stroke [facility]) 2006   right hemiplegia     Tobacco History: Social History   Tobacco Use  Smoking Status Former   Packs/day:  1.20   Years: 28.00   Total pack years: 33.60   Types: Cigarettes   Quit date: 05/11/2004   Years since quitting: 17.6  Smokeless Tobacco Never   Counseling given: Not Answered   Outpatient Medications Prior to Visit  Medication Sig Dispense Refill   acetaminophen (TYLENOL) 650 MG CR tablet Take 650 mg every 8 (eight) hours as needed by mouth for pain.     amLODipine (NORVASC) 10 MG tablet Take 10 mg by mouth daily.     aspirin 81 MG tablet Take 81 mg by mouth daily.     atorvastatin (LIPITOR) 40 MG tablet TAKE 1 TABLET BY MOUTH EVERY DAY 15 tablet 0   BAYER CONTOUR TEST test strip      BAYER MICROLET LANCETS lancets      Continuous Blood Gluc Sensor (FREESTYLE LIBRE 2 SENSOR) MISC REPLACE 1 SENSOR EVERY 14 DAYS     Dulaglutide (TRULICITY) 4.5 IN/8.6VE SOPN 4.47m Subcutaneous Once a week 90 days 6 mL 0   Dulaglutide (TRULICITY) 4.5 MHM/0.9OBSOPN 4.577mSubcutaneous Once a week 90 days 30 days 2 mL 2   fesoterodine (TOVIAZ) 8 MG TB24 tablet Take 8 mg daily by mouth.     JARDIANCE 10 MG TABS tablet Take 10 mg by mouth daily.     labetalol (NORMODYNE) 200 MG tablet Take 400 mg by mouth 2 (two) times daily. Take 2 tablets (40045mbid     losartan (COZAAR) 50 MG tablet Take 50 mg by mouth daily.     NOVOFINE 32G X 6 MM MISC      spironolactone (ALDACTONE) 25 MG tablet Take 12.5 mg by mouth 2 (two) times daily.     TOUJEO SOLOSTAR 300 UNIT/ML Solostar Pen Inject 28 Units into the skin.     No facility-administered medications prior to visit.   Review of Systems  Review of Systems  Constitutional: Negative.   HENT: Negative.    Respiratory: Negative.      Physical Exam  BP 136/84 (BP Location: Left Arm, Cuff Size: Normal)   Pulse 81   Ht '5\' 6"'  (1.676 m)   Wt 234 lb 6.4 oz (106.3 kg)   SpO2 98%   BMI 37.83 kg/m  Physical Exam Constitutional:      General: She is not in acute distress.    Appearance: Normal appearance. She is obese. She is not ill-appearing.  HENT:     Head:  Normocephalic and atraumatic.  Cardiovascular:     Rate and Rhythm: Normal rate and regular rhythm.  Pulmonary:     Effort: Pulmonary effort is normal.     Breath sounds: Normal breath sounds. No wheezing, rhonchi or rales.  Musculoskeletal:        General: Normal range of motion.  Skin:    General: Skin is warm and dry.  Neurological:     General: No focal deficit present.     Mental Status: She is alert and oriented to person, place, and time. Mental status is at baseline.      Lab  Results:  CBC    Component Value Date/Time   WBC 7.1 01/17/2007 1100   RBC 4.05 01/17/2007 1100   HGB 10.4 (L) 01/17/2007 1100   HCT 32.2 (L) 01/17/2007 1100   PLT 224 01/17/2007 1100   MCV 79.3 01/17/2007 1100   MCHC 32.2 01/17/2007 1100   RDW 15.8 (H) 01/17/2007 1100   LYMPHSABS 1.2 01/17/2007 1100   MONOABS 0.4 01/17/2007 1100   EOSABS 0.2 01/17/2007 1100   BASOSABS 0.0 01/17/2007 1100    BMET    Component Value Date/Time   NA 141 03/20/2013 1041   K 4.2 03/20/2013 1041   CL 104 03/20/2013 1041   CO2 28 03/20/2013 1041   GLUCOSE 66 (L) 03/20/2013 1041   BUN 17 03/20/2013 1041   CREATININE 1.83 (H) 03/20/2013 1041   CALCIUM 9.3 03/20/2013 1041   GFRNONAA >60 01/17/2007 1100   GFRAA  01/17/2007 1100    >60        The eGFR has been calculated using the MDRD equation. This calculation has not been validated in all clinical    BNP No results found for: "BNP"  ProBNP No results found for: "PROBNP"  Imaging: No results found.   Assessment & Plan:   OSA (obstructive sleep apnea) - Split night sleep study in September 2022 showed severe obstructive sleep apnea, AHI 124/hour. Body weight 240lbs. She was having difficulty figuring out how to use BIPAP, she recently received re-trainging for [employer]. Reviewed with patient risks of untreated sleep apnea and reinforced importance of BIPAP compliance. She is likely not a candidate for inspire device d/t current BMI but may a  considered in the future with weight loss.   Recommendations - Continue to wear BiPAP every night for 6 hours or longer - Avoid alcohol use prior to bedtime - Do not drive experiencing excessive daytime sleepiness or fatigue - If you continue to struggle with BiPAP you may be a candidate for inspire device with weight loss, we would need to refer you to ear nose and throat for initial assessment  Follow-up: - 3 months with Dr. Kaleen Mask, NP 01/16/2022

## 2022-01-16 NOTE — Assessment & Plan Note (Addendum)
-   Split night sleep study in September 2022 showed severe obstructive sleep apnea, AHI 124/hour. Body weight 240lbs. She was having difficulty figuring out how to use BIPAP, she recently received re-trainging for [employer]. Reviewed with patient risks of untreated sleep apnea and reinforced importance of BIPAP compliance. She is likely not a candidate for inspire device d/t current BMI but may a considered in the future with weight loss.   Recommendations - Continue to wear BiPAP every night for 6 hours or longer - Avoid alcohol use prior to bedtime - Do not drive experiencing excessive daytime sleepiness or fatigue - If you continue to struggle with BiPAP you may be a candidate for inspire device with weight loss, we would need to refer you to ear nose and throat for initial assessment  Follow-up: - 3 months with Dr. Annamaria Boots

## 2022-03-02 ENCOUNTER — Other Ambulatory Visit: Payer: Self-pay | Admitting: Internal Medicine

## 2022-03-02 DIAGNOSIS — I700 Atherosclerosis of aorta: Secondary | ICD-10-CM

## 2022-03-02 DIAGNOSIS — E785 Hyperlipidemia, unspecified: Secondary | ICD-10-CM

## 2022-03-02 DIAGNOSIS — N184 Chronic kidney disease, stage 4 (severe): Secondary | ICD-10-CM

## 2022-03-02 DIAGNOSIS — I10 Essential (primary) hypertension: Secondary | ICD-10-CM

## 2022-03-02 DIAGNOSIS — N183 Chronic kidney disease, stage 3 unspecified: Secondary | ICD-10-CM

## 2022-03-02 DIAGNOSIS — I129 Hypertensive chronic kidney disease with stage 1 through stage 4 chronic kidney disease, or unspecified chronic kidney disease: Secondary | ICD-10-CM

## 2022-03-10 ENCOUNTER — Other Ambulatory Visit: Payer: Self-pay | Admitting: Internal Medicine

## 2022-03-10 DIAGNOSIS — I10 Essential (primary) hypertension: Secondary | ICD-10-CM

## 2022-03-10 DIAGNOSIS — I129 Hypertensive chronic kidney disease with stage 1 through stage 4 chronic kidney disease, or unspecified chronic kidney disease: Secondary | ICD-10-CM

## 2022-03-10 DIAGNOSIS — I700 Atherosclerosis of aorta: Secondary | ICD-10-CM

## 2022-03-10 DIAGNOSIS — E785 Hyperlipidemia, unspecified: Secondary | ICD-10-CM

## 2022-03-10 DIAGNOSIS — N184 Chronic kidney disease, stage 4 (severe): Secondary | ICD-10-CM

## 2022-03-13 ENCOUNTER — Other Ambulatory Visit: Payer: Self-pay | Admitting: Internal Medicine

## 2022-03-13 DIAGNOSIS — I700 Atherosclerosis of aorta: Secondary | ICD-10-CM

## 2022-03-13 DIAGNOSIS — N183 Chronic kidney disease, stage 3 unspecified: Secondary | ICD-10-CM

## 2022-03-13 DIAGNOSIS — N184 Chronic kidney disease, stage 4 (severe): Secondary | ICD-10-CM

## 2022-03-13 DIAGNOSIS — E785 Hyperlipidemia, unspecified: Secondary | ICD-10-CM

## 2022-03-13 DIAGNOSIS — I10 Essential (primary) hypertension: Secondary | ICD-10-CM

## 2022-04-11 NOTE — Progress Notes (Signed)
$'@Patient'C$  ID: Robin Foley, female    DOB: [birth year], 66 y.o.   MRN: [id number]  No chief complaint on file.   Referring provider: Deland Pretty, MD  HPI: 66 year old female, former smoker quit in 2006 (33-pack-year history).  Past medical history significant for OSA, hypertension, stroke, Conn's syndrome, type 1 diabetes, chronic kidney disease stage III, morbid obesity.  Patient of Dr. Annamaria Boots  - NPSG 08/09/2011 severe obstructive sleep apnea, AHI 102/hr with SPO2 low 84%   - CPAP and adequate and titrated to BiPAP 20/16 with residual apneas and desaturation. Body weight 277 pounds.   - Home sleep study 04/12/2017, AHI 60.4 an hour with O2 low 54%, body weight 272 pounds.   - CPAP titration study 10/26/1915 CWP, min sat 90% - Split night sleep study on 01/13/21 showed severe OSA, AHI 124.6/hr. Started on BIPAP 25/21cm h20.   ===================================================================================  04/12/22- 66 yoF smoker followed for OSA complicated  by HTN, Aortic Atherosclerosis, DM1,  CPD, , Morbid Obesity, BIPAP 25/21/ [employer] Download compliance- SD card is blank Body weight today-239 lbs Covid vax- Flu vax-had She says she is using her BiPAP machine.  We can get her in touch with her DME company to try to get potentially defective card replaced.  She uses chinstrap.  Settings are comfortable and she sleeps better with it. Weight management is a long-term issue.  ROS-see HPI   + = positive Constitutional:    weight loss, night sweats, fevers, chills, fatigue, lassitude.   HEENT:    headaches, difficulty swallowing, tooth/dental problems, sore throat,       sneezing, itching, ear ache, nasal congestion, post nasal drip, snoring CV:    chest pain, orthopnea, PND, swelling in lower extremities, anasarca,                                   dizziness, palpitations Resp:   shortness of breath with exertion or at rest.                productive cough,   non-productive cough,  coughing up of blood.              change in color of mucus.  wheezing.   Skin:    rash or lesions. GI:  No-   heartburn, indigestion, abdominal pain, nausea, vomiting, diarrhea,                 change in bowel habits, loss of appetite GU: dysuria, change in color of urine, no urgency or frequency.   flank pain. MS:   joint pain, stiffness, decreased range of motion, back pain. Neuro-     nothing unusual Psych:  change in mood or affect.  depression or anxiety.   memory loss.  OBJ- Physical Exam General- Alert, Oriented, Affect-appropriate, Distress- none acute, + obese Skin- rash-none, lesions- none, excoriation- none Lymphadenopathy- none Head- atraumatic            Eyes- Gross vision intact, PERRLA, conjunctivae and secretions clear            Ears- Hearing, canals-normal            Nose- Clear, no-Septal dev, mucus, polyps, erosion, perforation             Throat- Mallampati III , mucosa clear , drainage- none, tonsils- atrophic Neck- flexible , trachea midline, no stridor , thyroid nl, carotid no bruit Chest - symmetrical excursion ,  unlabored           Heart/CV- RRR , no murmur , no gallop  , no rub, nl s1 s2                           - JVD- none , edema- none, stasis changes- none, varices- none           Lung- clear to P&A, wheeze- none, cough- none , dullness-none, rub- none           Chest wall-  Abd-  Br/ Gen/ Rectal- Not done, not indicated Extrem- cyanosis- none, clubbing, none, atrophy- none, strength- nl Neuro- grossly intact to observation

## 2022-04-12 ENCOUNTER — Ambulatory Visit: Payer: Medicare Other | Admitting: Internal Medicine

## 2022-04-12 ENCOUNTER — Encounter: Payer: Self-pay | Admitting: Internal Medicine

## 2022-04-12 VITALS — BP 134/66 | HR 78 | Ht 66.5 in | Wt 239.8 lb

## 2022-04-12 DIAGNOSIS — G4733 Obstructive sleep apnea (adult) (pediatric): Secondary | ICD-10-CM

## 2022-04-12 NOTE — Patient Instructions (Signed)
We will contact [employer] for download information to make sure your BIPAP machine is set right  Please call if we can help

## 2022-05-13 ENCOUNTER — Encounter: Payer: Self-pay | Admitting: Internal Medicine

## 2022-05-13 NOTE — Assessment & Plan Note (Signed)
She reports good compliance and control with improved sleep using BiPAP. Plan-DME [employer] to provide download access-[employer] or card

## 2022-05-13 NOTE — Assessment & Plan Note (Signed)
Work on diet and exercise towards trying to normalize body weight.

## 2022-05-18 ENCOUNTER — Other Ambulatory Visit: Payer: Self-pay | Admitting: Internal Medicine

## 2022-05-18 DIAGNOSIS — I129 Hypertensive chronic kidney disease with stage 1 through stage 4 chronic kidney disease, or unspecified chronic kidney disease: Secondary | ICD-10-CM

## 2022-05-18 DIAGNOSIS — I10 Essential (primary) hypertension: Secondary | ICD-10-CM

## 2022-05-18 DIAGNOSIS — N184 Chronic kidney disease, stage 4 (severe): Secondary | ICD-10-CM

## 2022-05-18 DIAGNOSIS — I700 Atherosclerosis of aorta: Secondary | ICD-10-CM

## 2022-05-18 DIAGNOSIS — N183 Chronic kidney disease, stage 3 unspecified: Secondary | ICD-10-CM

## 2022-05-18 DIAGNOSIS — E785 Hyperlipidemia, unspecified: Secondary | ICD-10-CM

## 2022-06-03 ENCOUNTER — Other Ambulatory Visit: Payer: Self-pay | Admitting: Internal Medicine

## 2022-06-03 DIAGNOSIS — I10 Essential (primary) hypertension: Secondary | ICD-10-CM

## 2022-06-03 DIAGNOSIS — E785 Hyperlipidemia, unspecified: Secondary | ICD-10-CM

## 2022-06-03 DIAGNOSIS — N184 Chronic kidney disease, stage 4 (severe): Secondary | ICD-10-CM

## 2022-06-03 DIAGNOSIS — N183 Chronic kidney disease, stage 3 unspecified: Secondary | ICD-10-CM

## 2022-06-03 DIAGNOSIS — I700 Atherosclerosis of aorta: Secondary | ICD-10-CM

## 2022-06-24 ENCOUNTER — Other Ambulatory Visit: Payer: Self-pay | Admitting: Internal Medicine

## 2022-06-24 DIAGNOSIS — I129 Hypertensive chronic kidney disease with stage 1 through stage 4 chronic kidney disease, or unspecified chronic kidney disease: Secondary | ICD-10-CM

## 2022-06-24 DIAGNOSIS — E785 Hyperlipidemia, unspecified: Secondary | ICD-10-CM

## 2022-06-24 DIAGNOSIS — N184 Chronic kidney disease, stage 4 (severe): Secondary | ICD-10-CM

## 2022-06-24 DIAGNOSIS — I700 Atherosclerosis of aorta: Secondary | ICD-10-CM

## 2022-06-24 DIAGNOSIS — I10 Essential (primary) hypertension: Secondary | ICD-10-CM

## 2022-07-02 ENCOUNTER — Other Ambulatory Visit: Payer: Self-pay | Admitting: Internal Medicine

## 2022-07-02 DIAGNOSIS — I10 Essential (primary) hypertension: Secondary | ICD-10-CM

## 2022-07-02 DIAGNOSIS — I129 Hypertensive chronic kidney disease with stage 1 through stage 4 chronic kidney disease, or unspecified chronic kidney disease: Secondary | ICD-10-CM

## 2022-07-02 DIAGNOSIS — N184 Chronic kidney disease, stage 4 (severe): Secondary | ICD-10-CM

## 2022-07-02 DIAGNOSIS — I700 Atherosclerosis of aorta: Secondary | ICD-10-CM

## 2022-07-02 DIAGNOSIS — E785 Hyperlipidemia, unspecified: Secondary | ICD-10-CM

## 2022-08-04 ENCOUNTER — Other Ambulatory Visit: Payer: Self-pay | Admitting: Internal Medicine

## 2022-08-04 DIAGNOSIS — E785 Hyperlipidemia, unspecified: Secondary | ICD-10-CM

## 2022-08-04 DIAGNOSIS — I700 Atherosclerosis of aorta: Secondary | ICD-10-CM

## 2022-08-04 DIAGNOSIS — N184 Chronic kidney disease, stage 4 (severe): Secondary | ICD-10-CM

## 2022-08-04 DIAGNOSIS — Z1231 Encounter for screening mammogram for malignant neoplasm of breast: Secondary | ICD-10-CM

## 2022-08-04 DIAGNOSIS — I10 Essential (primary) hypertension: Secondary | ICD-10-CM

## 2022-08-04 DIAGNOSIS — N183 Chronic kidney disease, stage 3 unspecified: Secondary | ICD-10-CM

## 2022-08-17 ENCOUNTER — Other Ambulatory Visit: Payer: Self-pay | Admitting: Nurse Practitioner

## 2022-08-17 DIAGNOSIS — E041 Nontoxic single thyroid nodule: Secondary | ICD-10-CM

## 2022-08-23 ENCOUNTER — Other Ambulatory Visit: Payer: Self-pay

## 2022-08-23 ENCOUNTER — Other Ambulatory Visit (HOSPITAL_BASED_OUTPATIENT_CLINIC_OR_DEPARTMENT_OTHER): Payer: Self-pay

## 2022-09-16 ENCOUNTER — Ambulatory Visit
Admission: RE | Admit: 2022-09-16 | Discharge: 2022-09-16 | Disposition: A | Discharge status: Home / Self Care | Payer: Medicare Other | Source: Ambulatory Visit | Attending: Nurse Practitioner | Admitting: Nurse Practitioner

## 2022-09-16 ENCOUNTER — Ambulatory Visit
Admission: RE | Admit: 2022-09-16 | Discharge: 2022-09-16 | Disposition: A | Discharge status: Home / Self Care | Payer: Medicare Other | Source: Ambulatory Visit | Attending: Internal Medicine | Admitting: Internal Medicine

## 2022-09-16 DIAGNOSIS — Z1231 Encounter for screening mammogram for malignant neoplasm of breast: Secondary | ICD-10-CM

## 2022-09-16 DIAGNOSIS — E041 Nontoxic single thyroid nodule: Secondary | ICD-10-CM

## 2022-09-26 ENCOUNTER — Other Ambulatory Visit (HOSPITAL_BASED_OUTPATIENT_CLINIC_OR_DEPARTMENT_OTHER): Payer: Self-pay

## 2022-09-28 ENCOUNTER — Other Ambulatory Visit (HOSPITAL_BASED_OUTPATIENT_CLINIC_OR_DEPARTMENT_OTHER): Payer: Self-pay

## 2022-09-29 ENCOUNTER — Other Ambulatory Visit (HOSPITAL_BASED_OUTPATIENT_CLINIC_OR_DEPARTMENT_OTHER): Payer: Self-pay

## 2022-12-21 ENCOUNTER — Other Ambulatory Visit (HOSPITAL_BASED_OUTPATIENT_CLINIC_OR_DEPARTMENT_OTHER): Payer: Self-pay

## 2022-12-21 MED ORDER — TRULICITY 1.5 MG/0.5ML ~~LOC~~ SOAJ
1.5000 mg | SUBCUTANEOUS | 3 refills | Status: DC
  Filled 2022-12-21: qty 2, 28d supply, fill #0

## 2023-01-15 ENCOUNTER — Other Ambulatory Visit (HOSPITAL_BASED_OUTPATIENT_CLINIC_OR_DEPARTMENT_OTHER): Payer: Self-pay

## 2023-01-16 ENCOUNTER — Other Ambulatory Visit (HOSPITAL_BASED_OUTPATIENT_CLINIC_OR_DEPARTMENT_OTHER): Payer: Self-pay

## 2023-01-16 MED ORDER — TRULICITY 1.5 MG/0.5ML ~~LOC~~ SOAJ
1.5000 mg | SUBCUTANEOUS | 3 refills | Status: DC
  Filled 2023-01-16: qty 2, 28d supply, fill #0
  Filled 2023-02-10: qty 2, 28d supply, fill #1
  Filled 2023-03-06: qty 2, 28d supply, fill #2
  Filled 2023-04-16: qty 2, 28d supply, fill #3

## 2023-02-10 ENCOUNTER — Other Ambulatory Visit (HOSPITAL_BASED_OUTPATIENT_CLINIC_OR_DEPARTMENT_OTHER): Payer: Self-pay

## 2023-02-11 ENCOUNTER — Other Ambulatory Visit (HOSPITAL_BASED_OUTPATIENT_CLINIC_OR_DEPARTMENT_OTHER): Payer: Self-pay

## 2023-04-17 ENCOUNTER — Other Ambulatory Visit (HOSPITAL_BASED_OUTPATIENT_CLINIC_OR_DEPARTMENT_OTHER): Payer: Self-pay

## 2023-04-18 ENCOUNTER — Other Ambulatory Visit (HOSPITAL_BASED_OUTPATIENT_CLINIC_OR_DEPARTMENT_OTHER): Payer: Self-pay

## 2023-05-21 ENCOUNTER — Other Ambulatory Visit (HOSPITAL_BASED_OUTPATIENT_CLINIC_OR_DEPARTMENT_OTHER): Payer: Self-pay

## 2023-05-22 ENCOUNTER — Other Ambulatory Visit (HOSPITAL_BASED_OUTPATIENT_CLINIC_OR_DEPARTMENT_OTHER): Payer: Self-pay

## 2023-05-22 MED ORDER — TRULICITY 1.5 MG/0.5ML ~~LOC~~ SOAJ
1.5000 mg | SUBCUTANEOUS | 3 refills | Status: DC
  Filled 2023-05-22: qty 2, 28d supply, fill #0
  Filled 2023-06-19: qty 2, 28d supply, fill #1
  Filled 2023-07-16: qty 2, 28d supply, fill #2
  Filled 2023-08-14: qty 2, 28d supply, fill #3

## 2023-05-23 ENCOUNTER — Other Ambulatory Visit (HOSPITAL_BASED_OUTPATIENT_CLINIC_OR_DEPARTMENT_OTHER): Payer: Self-pay

## 2023-06-19 ENCOUNTER — Other Ambulatory Visit (HOSPITAL_BASED_OUTPATIENT_CLINIC_OR_DEPARTMENT_OTHER): Payer: Self-pay

## 2023-07-17 ENCOUNTER — Other Ambulatory Visit (HOSPITAL_BASED_OUTPATIENT_CLINIC_OR_DEPARTMENT_OTHER): Payer: Self-pay

## 2023-07-18 ENCOUNTER — Other Ambulatory Visit (HOSPITAL_BASED_OUTPATIENT_CLINIC_OR_DEPARTMENT_OTHER): Payer: Self-pay

## 2023-08-15 ENCOUNTER — Other Ambulatory Visit (HOSPITAL_BASED_OUTPATIENT_CLINIC_OR_DEPARTMENT_OTHER): Payer: Self-pay

## 2023-10-01 ENCOUNTER — Other Ambulatory Visit (HOSPITAL_BASED_OUTPATIENT_CLINIC_OR_DEPARTMENT_OTHER): Payer: Self-pay

## 2023-10-02 ENCOUNTER — Other Ambulatory Visit (HOSPITAL_BASED_OUTPATIENT_CLINIC_OR_DEPARTMENT_OTHER): Payer: Self-pay

## 2023-10-02 MED ORDER — TRULICITY 1.5 MG/0.5ML ~~LOC~~ SOAJ
1.5000 mg | SUBCUTANEOUS | 0 refills | Status: DC
  Filled 2023-10-02: qty 2, 28d supply, fill #0

## 2023-10-03 ENCOUNTER — Other Ambulatory Visit (HOSPITAL_BASED_OUTPATIENT_CLINIC_OR_DEPARTMENT_OTHER): Payer: Self-pay

## 2023-10-05 ENCOUNTER — Other Ambulatory Visit (HOSPITAL_BASED_OUTPATIENT_CLINIC_OR_DEPARTMENT_OTHER): Payer: Self-pay

## 2023-11-06 ENCOUNTER — Other Ambulatory Visit (HOSPITAL_BASED_OUTPATIENT_CLINIC_OR_DEPARTMENT_OTHER): Payer: Self-pay

## 2023-11-06 MED ORDER — TRULICITY 1.5 MG/0.5ML ~~LOC~~ SOAJ
1.5000 mg | SUBCUTANEOUS | 0 refills | Status: DC
  Filled 2023-11-06: qty 2, 28d supply, fill #0

## 2023-11-08 ENCOUNTER — Other Ambulatory Visit (HOSPITAL_BASED_OUTPATIENT_CLINIC_OR_DEPARTMENT_OTHER): Payer: Self-pay

## 2024-01-01 ENCOUNTER — Encounter (HOSPITAL_BASED_OUTPATIENT_CLINIC_OR_DEPARTMENT_OTHER): Admitting: Pulmonary Disease

## 2024-01-07 ENCOUNTER — Other Ambulatory Visit (HOSPITAL_BASED_OUTPATIENT_CLINIC_OR_DEPARTMENT_OTHER): Payer: Self-pay

## 2024-01-09 ENCOUNTER — Other Ambulatory Visit (HOSPITAL_BASED_OUTPATIENT_CLINIC_OR_DEPARTMENT_OTHER): Payer: Self-pay

## 2024-01-10 ENCOUNTER — Other Ambulatory Visit: Payer: Self-pay

## 2024-01-11 ENCOUNTER — Other Ambulatory Visit (HOSPITAL_BASED_OUTPATIENT_CLINIC_OR_DEPARTMENT_OTHER): Payer: Self-pay

## 2024-01-11 MED ORDER — TRULICITY 1.5 MG/0.5ML ~~LOC~~ SOAJ
1.5000 mg | SUBCUTANEOUS | 1 refills | Status: DC
  Filled 2024-01-11: qty 2, 28d supply, fill #0
  Filled 2024-03-03: qty 2, 28d supply, fill #1

## 2024-01-11 MED ORDER — TRULICITY 1.5 MG/0.5ML ~~LOC~~ SOAJ
1.5000 mg | SUBCUTANEOUS | 0 refills | Status: AC
  Filled 2024-01-11: qty 2, 28d supply, fill #0

## 2024-01-13 ENCOUNTER — Other Ambulatory Visit (HOSPITAL_BASED_OUTPATIENT_CLINIC_OR_DEPARTMENT_OTHER): Payer: Self-pay

## 2024-01-22 ENCOUNTER — Encounter

## 2024-02-12 ENCOUNTER — Encounter (HOSPITAL_BASED_OUTPATIENT_CLINIC_OR_DEPARTMENT_OTHER): Payer: Self-pay | Admitting: Pulmonary Disease

## 2024-02-12 ENCOUNTER — Ambulatory Visit (HOSPITAL_BASED_OUTPATIENT_CLINIC_OR_DEPARTMENT_OTHER): Admitting: Pulmonary Disease

## 2024-02-12 VITALS — BP 131/62 | HR 79 | Ht 66.5 in | Wt 235.8 lb

## 2024-02-12 DIAGNOSIS — G4733 Obstructive sleep apnea (adult) (pediatric): Secondary | ICD-10-CM | POA: Diagnosis not present

## 2024-02-12 DIAGNOSIS — R0609 Other forms of dyspnea: Secondary | ICD-10-CM

## 2024-02-12 DIAGNOSIS — I5032 Chronic diastolic (congestive) heart failure: Secondary | ICD-10-CM | POA: Diagnosis not present

## 2024-02-12 DIAGNOSIS — Z23 Encounter for immunization: Secondary | ICD-10-CM

## 2024-02-12 DIAGNOSIS — R011 Cardiac murmur, unspecified: Secondary | ICD-10-CM | POA: Diagnosis not present

## 2024-02-12 DIAGNOSIS — J432 Centrilobular emphysema: Secondary | ICD-10-CM | POA: Diagnosis not present

## 2024-02-12 NOTE — Progress Notes (Signed)
 Established Patient Pulmonology Office Visit   Subjective:  Patient ID: Robin Foley, female    DOB: Jan 10, 1956  MRN: [id number]  CC:  Chief Complaint  Patient presents with   Establish Care    Emphysema    HPI  Robin Foley is a 68 year old female, former smoker quit in 2006 (33-pack-year history), OSA, hypertension, stroke, Conn's syndrome, type 1 diabetes, chronic kidney disease stage III, morbid obesity who presents to re-establish care for OSA and referred by PCP for emphysema.  Discussed the use of AI scribe software for clinical note transcription with the patient, who gave verbal consent to proceed.  History of Present Illness Robin Foley is a 68 year old female with sleep apnea and emphysema who presents for evaluation of her respiratory conditions. She was referred by her primary care doctor for evaluation of her emphysema and sleep apnea.  She has a history of sleep apnea and was previously prescribed a BiPAP machine, which she discontinued due to discomfort and claustrophobia. She used the BiPAP for over two years but has not used it consistently for some time. She continues to experience symptoms of sleep apnea, including snoring and probable apneic episodes. She has lost approximately 65-70 pounds since her initial diagnosis.  She has a history of emphysema, identified on a CT scan in 2021. She smoked close to a pack a day for about 20 years, quitting in 2006 following a stroke. She experiences occasional shortness of breath, particularly with exertion, but no wheezing or chronic cough. She has not been hospitalized for respiratory issues and is not currently using any inhalers.  Her past medical history includes hypertension, for which she takes amlodipine, labetalol, and losartan; hyperlipidemia, managed with atorvastatin ; and diabetes, for which she takes [place]. She also takes aspirin and spironolactone . No medication allergies.  She reports significant leg  swelling over the past few years, which has not been managed with diuretics. She has tried compression socks without success. She has a history of a heart murmur diagnosed long ago, but she does not currently see a cardiologist.  Her social history includes exposure to secondhand smoke from her father, who smokes inside the home. 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     03/14/2017   10:00 AM  Results of the Epworth flowsheet  Sitting and reading 1  Watching TV 1  Sitting, inactive in a public place (e.g. a theatre or a meeting) 0  As a passenger in a car for an hour without a break 1  Lying down to rest in the afternoon when circumstances permit 1  Sitting and talking to someone 0  Sitting quietly after a lunch without alcohol  1  In a car, while stopped for a few minutes in traffic 0  Total score 5    ROS    Current Outpatient Medications:    acetaminophen (TYLENOL) 650 MG CR tablet, Take 650 mg every 8 (eight) hours as needed by mouth for pain., Disp: , Rfl:    amLODipine (NORVASC) 10 MG tablet, Take 10 mg by mouth daily., Disp: , Rfl:    aspirin 81 MG tablet, Take 81 mg by mouth daily., Disp: , Rfl:    atorvastatin  (LIPITOR) 40 MG tablet, TAKE 1 TABLET BY MOUTH EVERY DAY, Disp: 15 tablet, Rfl: 0   Dulaglutide  (TRULICITY ) 1.5 MG/0.5ML SOAJ, Inject 1.5 mg into the skin every 7 (seven) days. NEED OFFICE VISIT FOR MORE REFILLS, Disp: 2 mL, Rfl:  1   fesoterodine (TOVIAZ) 8 MG TB24 tablet, Take 8 mg daily by mouth., Disp: , Rfl:    JARDIANCE 10 MG TABS tablet, Take 10 mg by mouth daily., Disp: , Rfl:    labetalol (NORMODYNE) 200 MG tablet, Take 400 mg by mouth 2 (two) times daily. Take 2 tablets (400mg ) bid, Disp: , Rfl:    losartan (COZAAR) 50 MG tablet, Take 50 mg by mouth daily., Disp: , Rfl:    spironolactone  (ALDACTONE ) 25 MG tablet, Take 12.5 mg by mouth 2 (two) times daily., Disp: , Rfl:    TOUJEO SOLOSTAR 300 UNIT/ML Solostar Pen,  Inject 28 Units into the skin., Disp: , Rfl:    BAYER CONTOUR TEST test strip, , Disp: , Rfl:    BAYER MICROLET LANCETS lancets, , Disp: , Rfl:    Continuous Blood Gluc Sensor (FREESTYLE LIBRE 2 SENSOR) MISC, REPLACE 1 SENSOR EVERY 14 DAYS, Disp: , Rfl:    Dulaglutide  (TRULICITY ) 1.5 MG/0.5ML SOAJ, Inject 1.5 mg into the skin once a week. Need office visit for more refills. (Patient not taking: Reported on 02/12/2024), Disp: 2 mL, Rfl: 0   Dulaglutide  (TRULICITY ) 4.5 MG/0.5ML SOPN, 4.5mg  Subcutaneous Once a week 90 days (Patient not taking: Reported on 02/12/2024), Disp: 6 mL, Rfl: 0   Dulaglutide  (TRULICITY ) 4.5 MG/0.5ML SOPN, Inject 4.5 mg into the skin once a week. (Patient not taking: Reported on 02/12/2024), Disp: 2 mL, Rfl: 2   NOVOFINE 32G X 6 MM MISC, , Disp: , Rfl:       Objective:  BP 131/62   Pulse 79   Ht 5' 6.5 (1.689 m)   Wt 235 lb 12.8 oz (107 kg)   SpO2 97%   BMI 37.49 kg/m  Wt Readings from Last 3 Encounters:  02/12/24 235 lb 12.8 oz (107 kg)  04/12/22 239 lb 12.8 oz (108.8 kg)  01/11/22 234 lb 6.4 oz (106.3 kg)   BMI Readings from Last 3 Encounters:  02/12/24 37.49 kg/m  04/12/22 38.12 kg/m  01/11/22 37.83 kg/m   SpO2 Readings from Last 3 Encounters:  02/12/24 97%  04/12/22 99%  01/11/22 98%   Physical Exam Physical Exam General: chronically ill appearing, no distress Eyes: PERRL, no scleral icterus ENMT: oropharynx clear, good dentition, no oral lesions, mallampati score IV Skin: warm, intact, no rashes Neck: JVD elevated, ROM and lymph node assessment normal CV: RRR, systolic murmur heard best at RUSB, nl S1 and S2, 2+ peripheral edema Resp: clear to auscultation bilaterally, no wheezes, rales, or rhonchi, normal effort, no clubbing/cyanosis Abdom: Normoactive bowel sounds, soft, nontender, nondistended, no hepatosplenomegaly Neuro: Awake alert oriented to person place time and situation  Diagnostic Review:  Last CBC Lab Results  Component Value  Date   WBC 7.1 01/17/2007   HGB 10.4 (L) 01/17/2007   HCT 32.2 (L) 01/17/2007   MCV 79.3 01/17/2007   RDW 15.8 (H) 01/17/2007   PLT 224 01/17/2007   Last metabolic panel Lab Results  Component Value Date   GLUCOSE 66 (L) 03/20/2013   NA 141 03/20/2013   K 4.2 03/20/2013   CL 104 03/20/2013   CO2 28 03/20/2013   BUN 17 03/20/2013   CREATININE 1.83 (H) 03/20/2013   CALCIUM  9.3 03/20/2013   PROT 7.1 09/03/2020   ALBUMIN 4.0 09/03/2020   BILITOT 0.2 09/03/2020   ALKPHOS 130 (H) 09/03/2020   AST 19 09/03/2020   ALT 15 09/03/2020   NPSG 08/09/2011 severe obstructive sleep apnea, AHI 102/hr with SPO2 low 84%  CPAP and adequate and titrated to BiPAP 20/16 with residual apneas and desaturation. Body weight 277 pounds.   - Home sleep study 04/12/2017, AHI 60.4 an hour with O2 low 54%, body weight 272 pounds.   - CPAP titration study 10/26/1915 CWP, min sat 90% - Split night sleep study on 01/13/21 showed severe OSA, AHI 124.6/hr. Started on BIPAP 25/21cm H2O.   Echo 2013: EF=>55%, mild conc LVH; borderline RV enlargement; LA mild-mod dilated; trace MR & TR, RVSP elevated   LDCT 2021: No pleural effusion identified. No airspace consolidation, atelectasis or pneumothorax. Mild centrilobular emphysema. Multiple small scattered calcified and noncalcified lung nodules are identified in both lungs. The largest noncalcified lung nodule is in the subpleural aspect of the anterolateral right upper lobe with an equivalent diameter of 3.1 mm. No new suspicious lung nodules.    Assessment & Plan:   Assessment & Plan Murmur  Chronic diastolic heart failure (HCC)  Centrilobular emphysema (HCC)  Dyspnea on exertion I suspect dyspnea is due to chronic diastolic heart failure. However, will proceed with PFTs and CXR for now to rule out restrictive/obstructive disease and parenchymal lung disease. Rest of the plan per below. OSA (obstructive sleep apnea)  Need for immunization against  influenza - Given flu shot Need for pneumococcal vaccine - Given PCV 20  Assessment & Plan Obstructive sleep apnea Severe obstructive sleep apnea with non-compliance due to discomfort and claustrophobia. Weight loss may have improved condition. Last split night sleep study on 01/13/21 showed severe OSA, AHI 124.6/hr. Previously on BIPAP 25/21 cm H2O. Will resume current plan. I submitted for mask re-fitting and renewal of BIPAP supplies. Patient had a weight loss of around 30 lbs since last sleep testing. - Renew BiPAP supplies including mask and tubing. - Arrange for new mask fitting. - Resume previous BiPAP settings. - Encourage compliance with BiPAP therapy. - Consider repeating sleep study if insurance denies request due to non-compliance.  Emphysema Previous CT showed very mild emphysema. Occasional dyspnea without chronic cough or wheezing. For now, will get PFTs to confirm diagnosis and assess severity. - Order pulmonary function test (PFT) to confirm diagnosis of emphysema. - Order chest x-ray to  Chronic Diastolic Heart Failure: Patient appears hypervolemic on examination with elevated JVD and 2+ pitting edema on examination. She does have a suspected AS murmur. Echocardiogram 2013 shows LVH and possible diastolic dysfunction. I referred patient to cardiology for evaluation of diastolic heart failure and murmur. I also ordered echocardiogram. She likely needs to be on a diuretic long term. Will defer to PCP or cardiology for that. - Refer to cardiology for evaluation of heart murmur and potential fluid management. - Order echocardiogram to assess cardiac function.  Heart murmur - Order echocardiogram. - Refer to cardiology for further evaluation of heart murmur.  Follow-Up Follow-up in three months to review test results and adjust management plan. - Schedule follow-up appointment in three months. - Ensure completion of echocardiogram, PFT, and chest x-ray before  follow-up.   Orders Placed This Encounter  Procedures   DG Chest 2 View   Flu vaccine HIGH DOSE PF(Fluzone Trivalent)   Pneumococcal conjugate vaccine 20-valent (Prevnar-20)   Ambulatory referral to Cardiology   AMB REFERRAL FOR DME   ECHOCARDIOGRAM COMPLETE   Pulmonary function test   I spent 51 minutes reviewing patient's chart including prior consultant notes, imaging, and PFTs as well as face-to-face with the patient, over half in discussion of the diagnosis and the importance of compliance with the treatment plan.  Return in about 3 months (around 05/14/2024).   Hawa Henly, MD

## 2024-02-12 NOTE — Patient Instructions (Addendum)
-   Lets do the echocardiogram, ultrasound of heart, chest x ray, and breathing test - I referred you to cardiology  - Come back see me in 3 months

## 2024-03-04 ENCOUNTER — Other Ambulatory Visit (HOSPITAL_BASED_OUTPATIENT_CLINIC_OR_DEPARTMENT_OTHER): Payer: Self-pay

## 2024-03-08 ENCOUNTER — Other Ambulatory Visit (HOSPITAL_BASED_OUTPATIENT_CLINIC_OR_DEPARTMENT_OTHER)

## 2024-04-02 ENCOUNTER — Other Ambulatory Visit (HOSPITAL_BASED_OUTPATIENT_CLINIC_OR_DEPARTMENT_OTHER): Payer: Self-pay

## 2024-04-02 MED ORDER — TRULICITY 1.5 MG/0.5ML ~~LOC~~ SOAJ
1.5000 mg | SUBCUTANEOUS | 3 refills | Status: AC
  Filled 2024-04-02: qty 2, 28d supply, fill #0
  Filled 2024-05-02: qty 2, 28d supply, fill #1
  Filled 2024-06-10: qty 2, 28d supply, fill #2

## 2024-04-03 ENCOUNTER — Other Ambulatory Visit (HOSPITAL_BASED_OUTPATIENT_CLINIC_OR_DEPARTMENT_OTHER): Payer: Self-pay

## 2024-05-03 ENCOUNTER — Other Ambulatory Visit (HOSPITAL_BASED_OUTPATIENT_CLINIC_OR_DEPARTMENT_OTHER): Payer: Self-pay

## 2024-05-07 ENCOUNTER — Other Ambulatory Visit (HOSPITAL_BASED_OUTPATIENT_CLINIC_OR_DEPARTMENT_OTHER): Payer: Self-pay

## 2024-05-10 ENCOUNTER — Encounter (HOSPITAL_BASED_OUTPATIENT_CLINIC_OR_DEPARTMENT_OTHER): Payer: Self-pay

## 2024-05-13 ENCOUNTER — Ambulatory Visit: Payer: Self-pay | Admitting: Pulmonary Disease

## 2024-05-13 ENCOUNTER — Ambulatory Visit (INDEPENDENT_AMBULATORY_CARE_PROVIDER_SITE_OTHER)

## 2024-05-13 DIAGNOSIS — R011 Cardiac murmur, unspecified: Secondary | ICD-10-CM

## 2024-05-13 DIAGNOSIS — I5032 Chronic diastolic (congestive) heart failure: Secondary | ICD-10-CM | POA: Diagnosis not present

## 2024-05-13 LAB — ECHOCARDIOGRAM COMPLETE
AV Vena cont: 0.3 cm
Area-P 1/2: 3.6 cm2
S' Lateral: 3.28 cm

## 2024-06-10 ENCOUNTER — Other Ambulatory Visit (HOSPITAL_BASED_OUTPATIENT_CLINIC_OR_DEPARTMENT_OTHER): Payer: Self-pay

## 2024-06-14 ENCOUNTER — Other Ambulatory Visit (HOSPITAL_BASED_OUTPATIENT_CLINIC_OR_DEPARTMENT_OTHER): Payer: Self-pay
